# Patient Record
Sex: Female | Born: 1960 | Race: Black or African American | Hispanic: No | Smoking: Never smoker
Health system: Southern US, Community
[De-identification: ages and names within clinical notes are randomized; demographics above are authoritative.]

## PROBLEM LIST (undated history)

## (undated) DIAGNOSIS — H269 Unspecified cataract: Secondary | ICD-10-CM

## (undated) DIAGNOSIS — F329 Major depressive disorder, single episode, unspecified: Secondary | ICD-10-CM

## (undated) DIAGNOSIS — I10 Essential (primary) hypertension: Secondary | ICD-10-CM

## (undated) DIAGNOSIS — F32A Depression, unspecified: Secondary | ICD-10-CM

## (undated) DIAGNOSIS — D649 Anemia, unspecified: Secondary | ICD-10-CM

## (undated) DIAGNOSIS — G43909 Migraine, unspecified, not intractable, without status migrainosus: Secondary | ICD-10-CM

## (undated) DIAGNOSIS — J45909 Unspecified asthma, uncomplicated: Secondary | ICD-10-CM

## (undated) HISTORY — DX: Unspecified cataract: H26.9

## (undated) HISTORY — DX: Depression, unspecified: F32.A

## (undated) HISTORY — DX: Major depressive disorder, single episode, unspecified: F32.9

## (undated) HISTORY — PX: SMALL INTESTINE SURGERY: SHX150

## (undated) HISTORY — DX: Unspecified asthma, uncomplicated: J45.909

## (undated) HISTORY — PX: BREAST BIOPSY: SHX20

## (undated) HISTORY — DX: Anemia, unspecified: D64.9

## (undated) HISTORY — PX: COLON SURGERY: SHX602

## (undated) HISTORY — DX: Migraine, unspecified, not intractable, without status migrainosus: G43.909

## (undated) HISTORY — DX: Essential (primary) hypertension: I10

---

## 2008-11-17 HISTORY — PX: BREAST BIOPSY: SHX20

## 2012-08-06 ENCOUNTER — Emergency Department: Payer: Self-pay | Admitting: Emergency Medicine

## 2012-08-06 LAB — COMPREHENSIVE METABOLIC PANEL
Albumin: 3.3 g/dL — ABNORMAL LOW (ref 3.4–5.0)
Alkaline Phosphatase: 78 U/L (ref 50–136)
Anion Gap: 12 (ref 7–16)
Calcium, Total: 9.1 mg/dL (ref 8.5–10.1)
Creatinine: 0.94 mg/dL (ref 0.60–1.30)
Glucose: 116 mg/dL — ABNORMAL HIGH (ref 65–99)
Potassium: 3.5 mmol/L (ref 3.5–5.1)
SGOT(AST): 15 U/L (ref 15–37)
SGPT (ALT): 17 U/L (ref 12–78)
Total Protein: 8.2 g/dL (ref 6.4–8.2)

## 2012-08-06 LAB — URINALYSIS, COMPLETE
Bacteria: NONE SEEN
Bilirubin,UR: NEGATIVE
Ketone: NEGATIVE
Leukocyte Esterase: NEGATIVE
Ph: 8 (ref 4.5–8.0)
Protein: NEGATIVE
RBC,UR: 5 /HPF (ref 0–5)
Specific Gravity: 1.018 (ref 1.003–1.030)
Squamous Epithelial: 2
WBC UR: 6 /HPF (ref 0–5)

## 2012-08-06 LAB — TROPONIN I: Troponin-I: <0.02 ng/mL

## 2012-08-06 LAB — CBC
HGB: 10.3 g/dL — ABNORMAL LOW (ref 12.0–16.0)
MCH: 22.3 pg — ABNORMAL LOW (ref 26.0–34.0)
MCHC: 31.2 g/dL — ABNORMAL LOW (ref 32.0–36.0)
MCV: 72 fL — ABNORMAL LOW (ref 80–100)
Platelet: 416 10*3/uL (ref 150–440)
RDW: 18.7 % — ABNORMAL HIGH (ref 11.5–14.5)

## 2012-08-06 LAB — LIPASE, BLOOD: Lipase: 78 U/L (ref 73–393)

## 2012-09-17 ENCOUNTER — Emergency Department: Payer: Self-pay | Admitting: Emergency Medicine

## 2015-04-05 ENCOUNTER — Ambulatory Visit: Payer: Self-pay

## 2015-04-10 ENCOUNTER — Other Ambulatory Visit: Payer: Self-pay

## 2015-04-12 ENCOUNTER — Ambulatory Visit: Payer: Self-pay | Admitting: Ophthalmology

## 2015-04-17 ENCOUNTER — Encounter (INDEPENDENT_AMBULATORY_CARE_PROVIDER_SITE_OTHER): Payer: Self-pay

## 2015-04-17 ENCOUNTER — Ambulatory Visit: Payer: Self-pay

## 2015-04-24 ENCOUNTER — Ambulatory Visit: Payer: Self-pay

## 2015-05-16 ENCOUNTER — Ambulatory Visit
Admission: RE | Admit: 2015-05-16 | Discharge: 2015-05-16 | Disposition: A | Discharge status: Home / Self Care | Payer: Self-pay | Source: Ambulatory Visit | Attending: Oncology | Admitting: Oncology

## 2015-05-16 ENCOUNTER — Ambulatory Visit: Payer: Self-pay | Attending: Oncology

## 2015-05-16 VITALS — BP 136/95 | HR 95 | Temp 98.1°F | Resp 18 | Ht 64.57 in | Wt 225.8 lb

## 2015-05-16 DIAGNOSIS — Z0000 Encounter for general adult medical examination without abnormal findings: Secondary | ICD-10-CM

## 2015-05-16 NOTE — Progress Notes (Signed)
Subjective:     Patient ID: Kerri Lester, female   DOB: 11-12-1961, 54 y.o.   MRN: [id number]  HPI   Review of Systems     Objective:   Physical Exam  Pulmonary/Chest:    Large pendulous breasts.  Previous benign left breast biopsies 1977, and [facility].    Genitourinary: Cervix exhibits no motion tenderness, no discharge and no friability. Right adnexum displays no mass, no tenderness and no fullness. Left adnexum displays no mass, no tenderness and no fullness.       Assessment:     54 year old patient presents for [facility] clinic visit.  Patient screened, and meets [facility] eligibility.  Patient does not have insurance, Medicare or Medicaid.  Handout given on Affordable Care Act. nstructed patient on breast self-exam using teach back method.  CBE unremarkable    Plan:     Sent for bilateral screening mammogram.  Specimen collected for pap.

## 2015-05-22 ENCOUNTER — Ambulatory Visit: Payer: Self-pay

## 2015-05-22 LAB — PAP LB AND HPV HIGH-RISK
HPV, HIGH-RISK: NEGATIVE
PAP Smear Comment: 0

## 2015-05-29 NOTE — Progress Notes (Signed)
Letter mailed to patient to notify of normal mammogram, and pap smear results.Patient to return in one year for annual screening.  Copy to [facility]. 

## 2015-06-07 ENCOUNTER — Ambulatory Visit: Payer: Self-pay

## 2015-10-04 ENCOUNTER — Ambulatory Visit: Payer: Self-pay

## 2015-10-16 ENCOUNTER — Other Ambulatory Visit: Payer: Self-pay

## 2015-10-25 ENCOUNTER — Ambulatory Visit: Payer: Self-pay

## 2015-10-25 DIAGNOSIS — R7303 Prediabetes: Secondary | ICD-10-CM | POA: Insufficient documentation

## 2015-10-25 DIAGNOSIS — J45909 Unspecified asthma, uncomplicated: Secondary | ICD-10-CM | POA: Insufficient documentation

## 2015-12-27 ENCOUNTER — Ambulatory Visit: Payer: Self-pay

## 2015-12-27 DIAGNOSIS — I10 Essential (primary) hypertension: Secondary | ICD-10-CM | POA: Insufficient documentation

## 2015-12-27 LAB — HEMOGLOBIN A1C: HEMOGLOBIN A1C: 6

## 2015-12-27 LAB — BASIC METABOLIC PANEL
BUN: 11 mg/dL (ref 4–21)
CREATININE: 0.8 mg/dL (ref 0.5–1.1)
Glucose: 81 mg/dL
Potassium: 4.1 mmol/L (ref 3.4–5.3)
SODIUM: 142 mmol/L (ref 137–147)

## 2015-12-27 LAB — LIPID PANEL
CHOLESTEROL: 268 mg/dL — AB (ref 0–200)
HDL: 48 mg/dL (ref 35–70)
LDL Cholesterol: 179 mg/dL
TRIGLYCERIDES: 205 mg/dL — AB (ref 40–160)

## 2015-12-27 LAB — CBC AND DIFFERENTIAL
HEMATOCRIT: 42 % (ref 36–46)
Hemoglobin: 13.9 g/dL (ref 12.0–16.0)
PLATELETS: 418 10*3/uL — AB (ref 150–399)
WBC: 8.5 10^3/mL

## 2015-12-27 LAB — HEPATIC FUNCTION PANEL
ALK PHOS: 94 U/L (ref 25–125)
ALT: 16 U/L (ref 7–35)
AST: 13 U/L (ref 13–35)
Bilirubin, Total: 0.3 mg/dL

## 2016-01-01 ENCOUNTER — Other Ambulatory Visit: Payer: Self-pay | Admitting: Nurse Practitioner

## 2016-01-01 DIAGNOSIS — R519 Headache, unspecified: Secondary | ICD-10-CM

## 2016-01-01 DIAGNOSIS — R51 Headache: Principal | ICD-10-CM

## 2016-01-04 ENCOUNTER — Ambulatory Visit
Admission: RE | Admit: 2016-01-04 | Discharge: 2016-01-04 | Disposition: A | Discharge status: Home / Self Care | Payer: Self-pay | Source: Ambulatory Visit | Attending: Nurse Practitioner | Admitting: Nurse Practitioner

## 2016-01-04 DIAGNOSIS — G932 Benign intracranial hypertension: Secondary | ICD-10-CM | POA: Insufficient documentation

## 2016-01-04 DIAGNOSIS — R51 Headache: Secondary | ICD-10-CM

## 2016-01-04 DIAGNOSIS — R519 Headache, unspecified: Secondary | ICD-10-CM

## 2016-01-04 DIAGNOSIS — G4452 New daily persistent headache (NDPH): Secondary | ICD-10-CM | POA: Insufficient documentation

## 2016-02-06 DIAGNOSIS — R7303 Prediabetes: Secondary | ICD-10-CM

## 2016-02-06 DIAGNOSIS — I10 Essential (primary) hypertension: Secondary | ICD-10-CM

## 2016-02-06 DIAGNOSIS — J45909 Unspecified asthma, uncomplicated: Secondary | ICD-10-CM

## 2016-03-27 ENCOUNTER — Other Ambulatory Visit: Payer: Self-pay

## 2016-03-27 DIAGNOSIS — I10 Essential (primary) hypertension: Secondary | ICD-10-CM

## 2016-03-27 DIAGNOSIS — E782 Mixed hyperlipidemia: Secondary | ICD-10-CM

## 2016-03-27 DIAGNOSIS — E119 Type 2 diabetes mellitus without complications: Secondary | ICD-10-CM

## 2016-03-28 LAB — CBC WITH DIFFERENTIAL/PLATELET
BASOS ABS: 0 10*3/uL (ref 0.0–0.2)
Basos: 0 %
EOS (ABSOLUTE): 0.1 10*3/uL (ref 0.0–0.4)
Eos: 1 %
HEMATOCRIT: 41.3 % (ref 34.0–46.6)
HEMOGLOBIN: 13.1 g/dL (ref 11.1–15.9)
Immature Grans (Abs): 0 10*3/uL (ref 0.0–0.1)
Immature Granulocytes: 0 %
LYMPHS ABS: 2.3 10*3/uL (ref 0.7–3.1)
Lymphs: 21 %
MCH: 27 pg (ref 26.6–33.0)
MCHC: 31.7 g/dL (ref 31.5–35.7)
MCV: 85 fL (ref 79–97)
MONOCYTES: 5 %
Monocytes Absolute: 0.5 10*3/uL (ref 0.1–0.9)
NEUTROS ABS: 8 10*3/uL — AB (ref 1.4–7.0)
Neutrophils: 73 %
PLATELETS: 454 10*3/uL — AB (ref 150–379)
RBC: 4.85 x10E6/uL (ref 3.77–5.28)
RDW: 15.8 % — AB (ref 12.3–15.4)
WBC: 10.9 10*3/uL — ABNORMAL HIGH (ref 3.4–10.8)

## 2016-03-28 LAB — COMPREHENSIVE METABOLIC PANEL
A/G RATIO: 1 — AB (ref 1.2–2.2)
ALBUMIN: 4 g/dL (ref 3.5–5.5)
ALT: 14 IU/L (ref 0–32)
AST: 13 IU/L (ref 0–40)
Alkaline Phosphatase: 78 IU/L (ref 39–117)
BUN / CREAT RATIO: 13 (ref 9–23)
BUN: 13 mg/dL (ref 6–24)
CALCIUM: 9.4 mg/dL (ref 8.7–10.2)
CHLORIDE: 100 mmol/L (ref 96–106)
CO2: 21 mmol/L (ref 18–29)
Creatinine, Ser: 0.99 mg/dL (ref 0.57–1.00)
GFR, EST AFRICAN AMERICAN: 76 mL/min/{1.73_m2} (ref >59)
GFR, EST NON AFRICAN AMERICAN: 66 mL/min/{1.73_m2} (ref >59)
GLOBULIN, TOTAL: 3.9 g/dL (ref 1.5–4.5)
Glucose: 105 mg/dL — ABNORMAL HIGH (ref 65–99)
POTASSIUM: 3.6 mmol/L (ref 3.5–5.2)
Sodium: 140 mmol/L (ref 134–144)
TOTAL PROTEIN: 7.9 g/dL (ref 6.0–8.5)

## 2016-03-28 LAB — LIPID PANEL
CHOL/HDL RATIO: 5.8 ratio — AB (ref 0.0–4.4)
CHOLESTEROL TOTAL: 232 mg/dL — AB (ref 100–199)
HDL: 40 mg/dL (ref >39)
LDL Calculated: 159 mg/dL — ABNORMAL HIGH (ref 0–99)
Triglycerides: 165 mg/dL — ABNORMAL HIGH (ref 0–149)
VLDL Cholesterol Cal: 33 mg/dL (ref 5–40)

## 2016-03-28 LAB — TSH: TSH: 0.57 u[IU]/mL (ref 0.450–4.500)

## 2016-03-28 LAB — HEMOGLOBIN A1C
Est. average glucose Bld gHb Est-mCnc: 134 mg/dL
Hgb A1c MFr Bld: 6.3 % — ABNORMAL HIGH (ref 4.8–5.6)

## 2016-04-03 ENCOUNTER — Ambulatory Visit: Payer: Self-pay | Admitting: Urology

## 2016-04-03 VITALS — BP 141/95 | HR 90 | Wt 235.0 lb

## 2016-04-03 DIAGNOSIS — R7303 Prediabetes: Secondary | ICD-10-CM

## 2016-04-03 DIAGNOSIS — I10 Essential (primary) hypertension: Secondary | ICD-10-CM

## 2016-04-03 NOTE — Progress Notes (Signed)
Patient: Kerri Lester Female    DOB: 05-01-1961   55 y.o.   MRN: [id number] Visit Date: 04/03/2016  Today's Provider: [facility]   No chief complaint on file.  Subjective:    HPI Patient is here to review labs.  Total cholesterol is at 232-sometimes she eats fried foods, water and sodas, walking since the first of the year x 2 weekly  Asthma-albuterol twice daily, symbicort, veramyst, singulair  DM-controlled with diet  HA- CT of head normal      No Known Allergies Previous Medications   ALBUTEROL (PROVENTIL HFA;VENTOLIN HFA) 108 (90 BASE) MCG/ACT INHALER    Inhale 2 puffs into the lungs every 6 (six) hours as needed for wheezing or shortness of breath.   AMLODIPINE (NORVASC) 5 MG TABLET    Take 5 mg by mouth daily.   BUDESONIDE-FORMOTEROL (SYMBICORT) 160-4.5 MCG/ACT INHALER    Inhale 2 puffs into the lungs 2 (two) times daily.   CETIRIZINE (ZYRTEC) 10 MG TABLET    Take 10 mg by mouth daily.   DICLOFENAC (CATAFLAM) 50 MG TABLET    Take 50 mg by mouth as needed (1 50 mg tab PO with food every 8 hrs PRN.).   FLUOXETINE (PROZAC) 20 MG TABLET    Take 20 mg by mouth daily.   FLUTICASONE (VERAMYST) 27.5 MCG/SPRAY NASAL SPRAY    Place 2 sprays into the nose daily.   MONTELUKAST (SINGULAIR) 10 MG TABLET    Take 10 mg by mouth at bedtime. Reported on 04/03/2016   OMEPRAZOLE (PRILOSEC) 20 MG CAPSULE    Take 40 mg by mouth daily.    TRIAMTERENE-HYDROCHLOROTHIAZIDE (MAXZIDE-25) 37.5-25 MG TABLET    Take 0.5 tablets by mouth daily.    Review of Systems  Constitutional: Negative.   HENT: Negative.   Eyes: Negative.   Respiratory: Negative.   Cardiovascular: Negative.   Gastrointestinal: Negative.   Endocrine: Negative.   Genitourinary: Negative.   Musculoskeletal: Negative.   Skin: Negative.   Allergic/Immunologic: Negative.   Neurological: Negative.   Hematological: Negative.   Psychiatric/Behavioral: Negative.     Social History  Substance Use Topics  .  Smoking status: Never Smoker   . Smokeless tobacco: Never Used  . Alcohol Use: 0.6 oz/week    1 Cans of beer per week     Comment: Socially    Objective:   BP 141/95 mmHg  Pulse 90  Wt 235 lb (106.595 kg)  LMP 03/20/2016 (Approximate)  Physical Exam  Constitutional: She is oriented to person, place, and time. She appears well-developed and well-nourished.  HENT:  Head: Normocephalic and atraumatic.  Eyes: Conjunctivae and EOM are normal. Pupils are equal, round, and reactive to light.  Cardiovascular: Normal rate and normal heart sounds.   Pulmonary/Chest: Effort normal and breath sounds normal.  Abdominal: Soft. Bowel sounds are normal.  Musculoskeletal: Normal range of motion.  Neurological: She is oriented to person, place, and time. She has normal reflexes.  Skin: Skin is warm.  Psychiatric: She has a normal mood and affect. Her behavior is normal. Judgment and thought content normal.        Assessment & Plan:     1. HLD  -discuss diet  -increase exercise  2. GERD  -continue omprezole  3. Elevated platelets  -confirm with repeated platelets  -follow up in one month  4. HTN  -continue amlodipine  5. Headache  -Head CT normal    Patient had to leave, [place] infant  [facility]  [facility]

## 2016-04-10 ENCOUNTER — Ambulatory Visit: Payer: Self-pay | Admitting: Ophthalmology

## 2016-05-01 ENCOUNTER — Other Ambulatory Visit: Payer: Self-pay

## 2016-05-08 ENCOUNTER — Ambulatory Visit: Payer: Self-pay

## 2016-07-25 IMAGING — CT CT HEAD W/O CM
2 series · 15 of 30 positions shown, 17 images · non-contrast
Comparison: None.

CLINICAL DATA: Severe headache behind the right eye for 2 to
months. History of pseudotumor cerebri.

EXAM:
CT HEAD WITHOUT CONTRAST
TECHNIQUE: Contiguous axial images were obtained from the base of the skull
through the vertex without intravenous contrast.

[Series 2: head wo · axial · 0.41mm/px · z∈[-76,+34]mm · 7 of 30 slices shown, 9 images]
[im 4/30  brain]
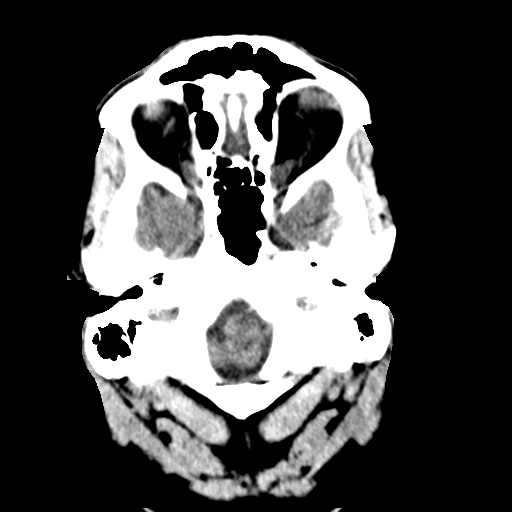
[im 4/30  bone]
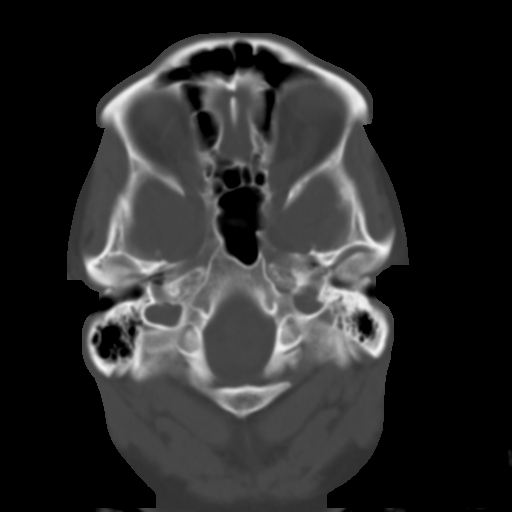
[im 8/30  brain]
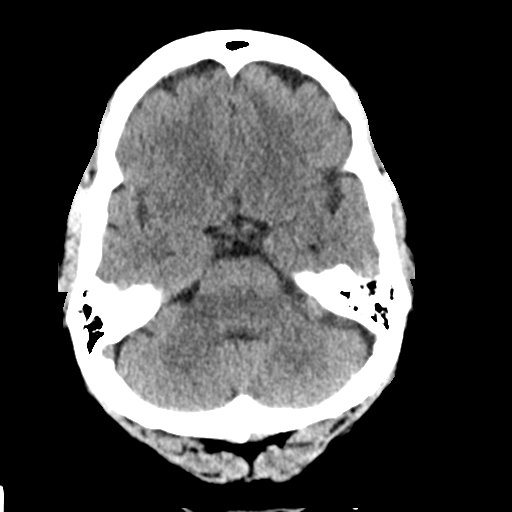
[im 11/30  brain]
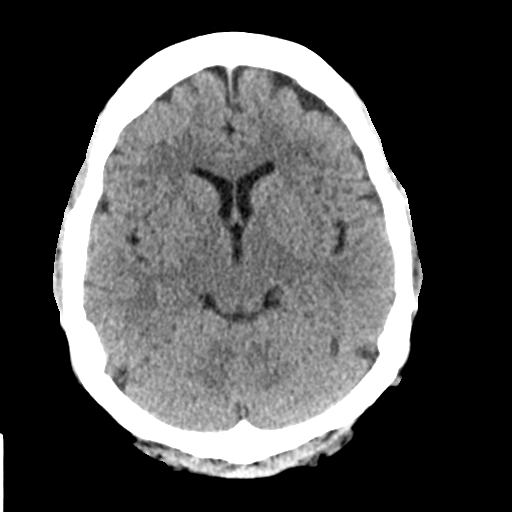
[im 15/30  brain]
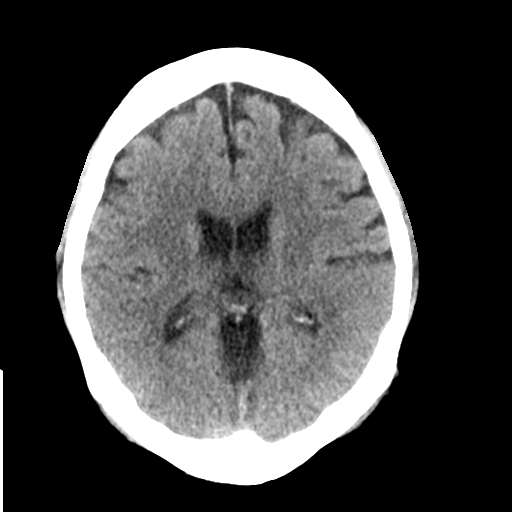
[im 19/30  brain]
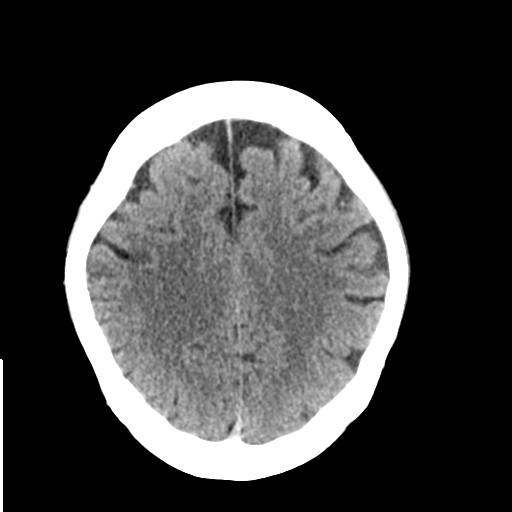
[im 19/30  bone]
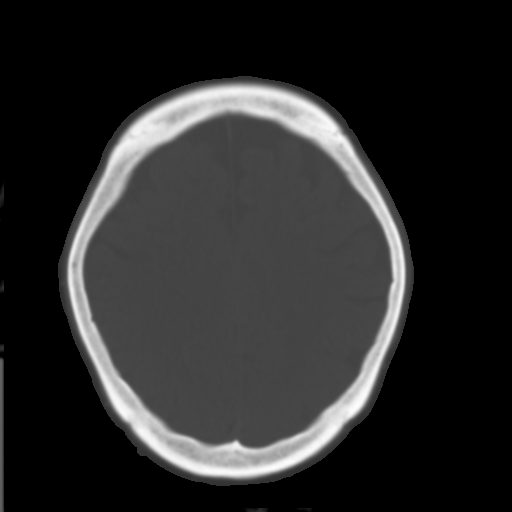
[im 22/30  brain]
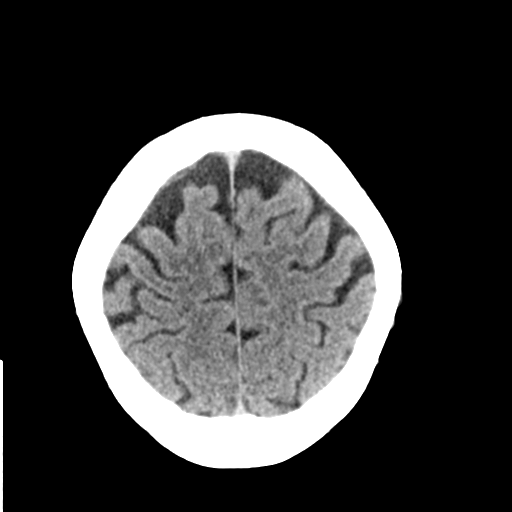
[im 26/30  brain]
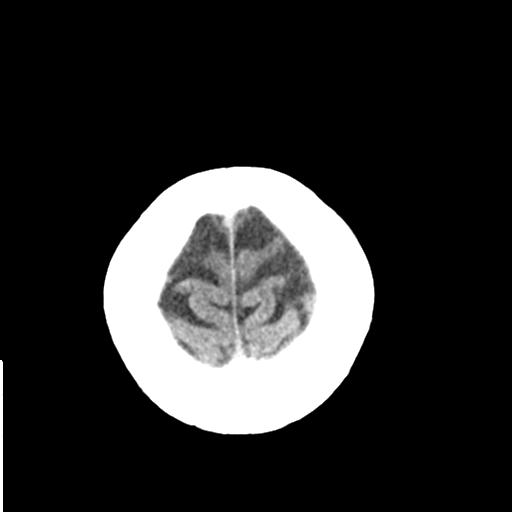

[Series 3: head bone · axial · 0.41mm/px · z∈[-77,+41]mm · 8 of 75 slices shown]
[im 8/75  bone]
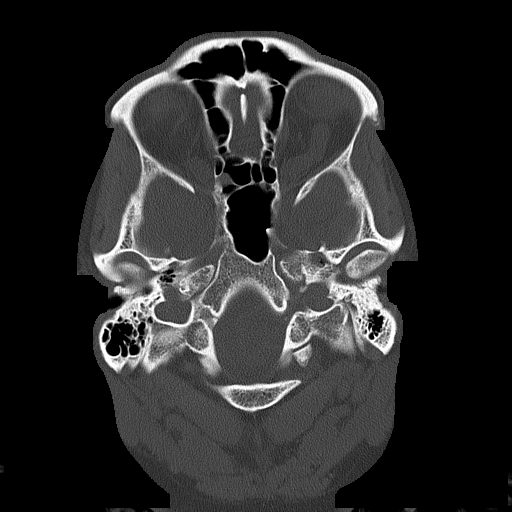
[im 15/75  bone]
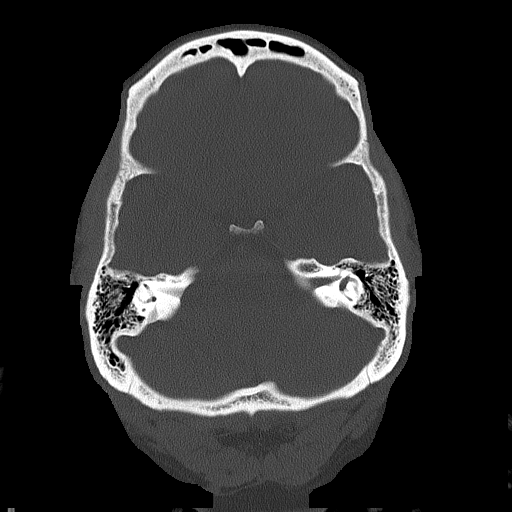
[im 23/75  bone]
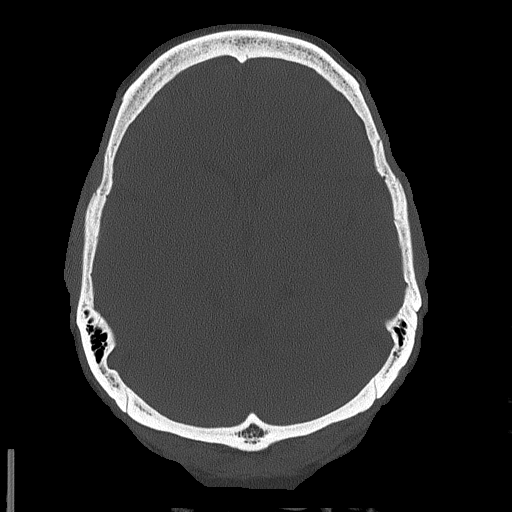
[im 34/75  bone]
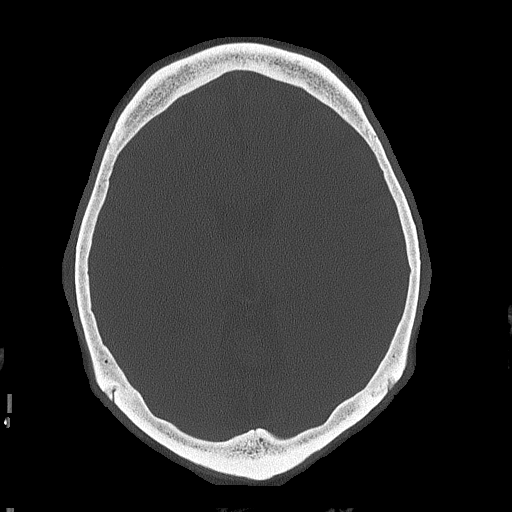
[im 41/75  bone]
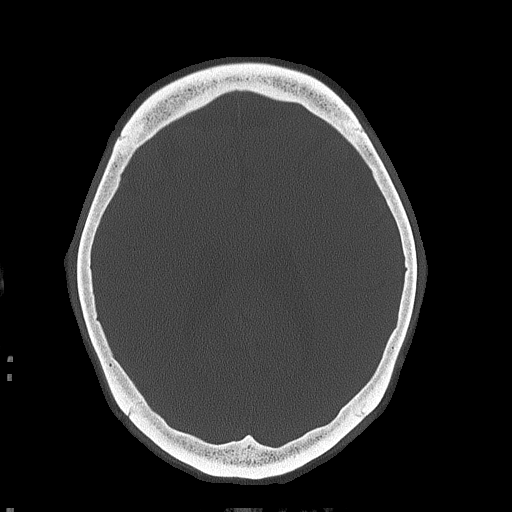
[im 52/75  bone]
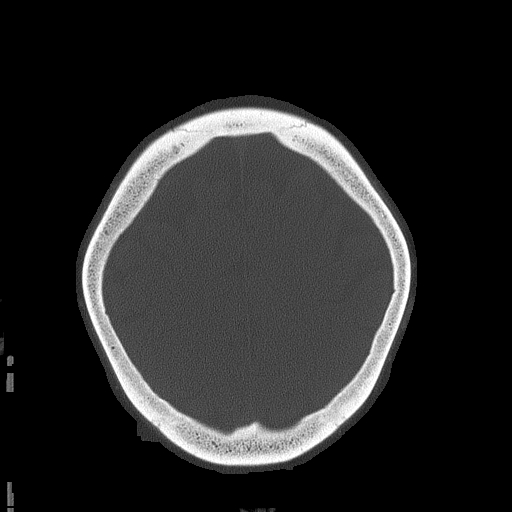
[im 60/75  bone]
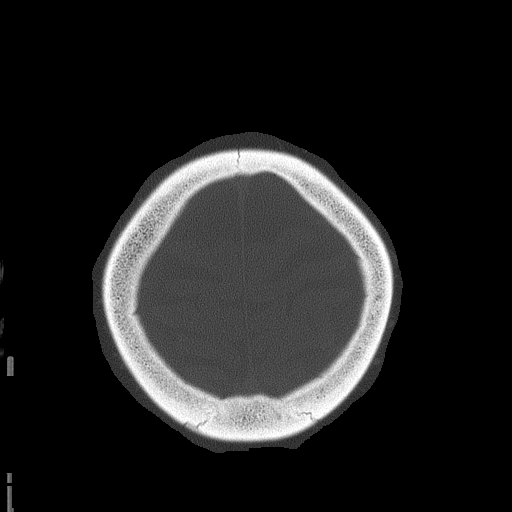
[im 67/75  bone]
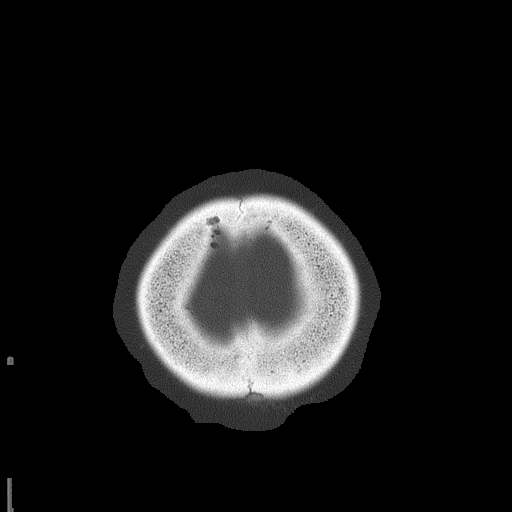

[15 of 30 positions shown; findings below may reference images not displayed]

FINDINGS: Brain: No evidence of acute infarction, hemorrhage, extra-axial
collection, ventriculomegaly, or mass effect.

Vascular: No hyperdense vessel or unexpected calcification.

Skull: Negative for fracture or focal lesion.

Sinuses/Orbits: No acute findings.

Other: None.
IMPRESSION: Normal unenhanced CT scan of the brain

## 2016-12-09 ENCOUNTER — Other Ambulatory Visit: Payer: Self-pay

## 2016-12-09 NOTE — Telephone Encounter (Signed)
Received PAP application from [facility] for Ventolin HFA placed for provider to sign. 

## 2016-12-12 NOTE — Telephone Encounter (Signed)
Placed signed application/script in [facility] folder for pickup. 

## 2017-04-09 ENCOUNTER — Ambulatory Visit: Payer: Self-pay | Admitting: Ophthalmology

## 2017-04-16 ENCOUNTER — Other Ambulatory Visit: Payer: Self-pay

## 2017-04-16 DIAGNOSIS — Z0000 Encounter for general adult medical examination without abnormal findings: Secondary | ICD-10-CM

## 2017-04-17 LAB — CBC WITH DIFFERENTIAL
BASOS: 0 %
Basophils Absolute: 0 10*3/uL (ref 0.0–0.2)
EOS (ABSOLUTE): 0.3 10*3/uL (ref 0.0–0.4)
EOS: 3 %
HEMATOCRIT: 40.9 % (ref 34.0–46.6)
HEMOGLOBIN: 13.2 g/dL (ref 11.1–15.9)
Immature Grans (Abs): 0 10*3/uL (ref 0.0–0.1)
Immature Granulocytes: 0 %
LYMPHS ABS: 2.5 10*3/uL (ref 0.7–3.1)
Lymphs: 29 %
MCH: 27.1 pg (ref 26.6–33.0)
MCHC: 32.3 g/dL (ref 31.5–35.7)
MCV: 84 fL (ref 79–97)
MONOCYTES: 5 %
Monocytes Absolute: 0.4 10*3/uL (ref 0.1–0.9)
NEUTROS ABS: 5.4 10*3/uL (ref 1.4–7.0)
Neutrophils: 63 %
RBC: 4.87 x10E6/uL (ref 3.77–5.28)
RDW: 16.1 % — ABNORMAL HIGH (ref 12.3–15.4)
WBC: 8.7 10*3/uL (ref 3.4–10.8)

## 2017-04-23 ENCOUNTER — Ambulatory Visit: Payer: Self-pay

## 2017-06-10 ENCOUNTER — Telehealth: Payer: Self-pay | Admitting: Nurse Practitioner

## 2017-06-10 NOTE — Telephone Encounter (Signed)
Wants to be new pt. Call back to make appointment

## 2017-06-11 NOTE — Telephone Encounter (Signed)
She is an existing patient

## 2017-07-09 ENCOUNTER — Ambulatory Visit: Payer: Self-pay

## 2017-08-19 DIAGNOSIS — K5792 Diverticulitis of intestine, part unspecified, without perforation or abscess without bleeding: Secondary | ICD-10-CM | POA: Insufficient documentation

## 2017-12-20 DIAGNOSIS — E66812 Obesity, class 2: Secondary | ICD-10-CM | POA: Insufficient documentation

## 2017-12-20 DIAGNOSIS — Z932 Ileostomy status: Secondary | ICD-10-CM | POA: Insufficient documentation

## 2018-04-15 ENCOUNTER — Ambulatory Visit: Payer: Self-pay | Admitting: Ophthalmology

## 2018-04-29 ENCOUNTER — Ambulatory Visit: Payer: Self-pay | Admitting: Ophthalmology

## 2018-05-06 ENCOUNTER — Ambulatory Visit: Payer: Self-pay

## 2018-06-17 ENCOUNTER — Ambulatory Visit: Payer: Medicaid Other | Admitting: Urology

## 2018-06-17 VITALS — BP 157/99 | HR 86 | Temp 98.4°F | Ht 64.0 in | Wt 229.7 lb

## 2018-06-17 DIAGNOSIS — E041 Nontoxic single thyroid nodule: Secondary | ICD-10-CM

## 2018-06-17 DIAGNOSIS — R55 Syncope and collapse: Secondary | ICD-10-CM

## 2018-06-17 MED ORDER — ALBUTEROL SULFATE HFA 108 (90 BASE) MCG/ACT IN AERS: 2.0000 | INHALATION_SPRAY | Freq: Four times a day (QID) | RESPIRATORY_TRACT | 3 refills | Status: AC | PRN

## 2018-06-17 MED ORDER — CETIRIZINE HCL 10 MG PO TABS: 10.0000 mg | ORAL_TABLET | Freq: Every day | ORAL | 3 refills | Status: AC

## 2018-06-17 MED ORDER — AMLODIPINE BESYLATE 5 MG PO TABS: 5.0000 mg | ORAL_TABLET | Freq: Every day | ORAL | 3 refills | Status: AC

## 2018-06-17 MED ORDER — FLUOXETINE HCL 20 MG PO TABS: 20.0000 mg | ORAL_TABLET | Freq: Every day | ORAL | 3 refills | Status: DC

## 2018-06-17 MED ORDER — MONTELUKAST SODIUM 10 MG PO TABS: 10.0000 mg | ORAL_TABLET | Freq: Every day | ORAL | 3 refills | Status: AC

## 2018-06-17 MED ORDER — TRIAMTERENE-HCTZ 37.5-25 MG PO TABS: 0.5000 | ORAL_TABLET | Freq: Every day | ORAL | 3 refills | Status: AC

## 2018-06-17 MED ORDER — BUDESONIDE-FORMOTEROL FUMARATE 160-4.5 MCG/ACT IN AERO: 2.0000 | INHALATION_SPRAY | Freq: Two times a day (BID) | RESPIRATORY_TRACT | 3 refills | Status: AC

## 2018-06-17 MED ORDER — DICLOFENAC POTASSIUM 50 MG PO TABS: 50.0000 mg | ORAL_TABLET | ORAL | 0 refills | Status: AC | PRN

## 2018-06-17 MED ORDER — FLUTICASONE FUROATE 27.5 MCG/SPRAY NA SUSP: 2.0000 | Freq: Every day | NASAL | 3 refills | Status: AC

## 2018-06-17 MED ORDER — OMEPRAZOLE 20 MG PO CPDR: 40.0000 mg | DELAYED_RELEASE_CAPSULE | Freq: Every day | ORAL | 3 refills | Status: AC

## 2018-06-17 NOTE — Progress Notes (Signed)
  Patient: Kerri Lester Female    DOB: [birth year]   57 y.o.   MRN: [id number] Visit Date: 06/17/2018  Today's Provider: Michiel CowboySHANNON Aleksander Edmiston, PA-C   Chief Complaint  Patient presents with  . Medication Refill  . Headache    dizzy, light-headed, blurry vision, vomiting (patient recently admitted to ED for head)   Subjective:    HPI [facility] on 05/28/2018   Troponin negative, CBC and CMP unremarkable, EKG normal  Still having dizziness, nausea, light headed, vomiting    Having a lot of stress lately   Medications refilled - has been out for BP meds for one week   No Known Allergies Previous Medications   No medications on file    Review of Systems  Neurological: Positive for dizziness, light-headedness and headaches.  All other systems reviewed and are negative.   Social History   Tobacco Use  . Smoking status: Never Smoker  . Smokeless tobacco: Never Used  Substance Use Topics  . Alcohol use: Yes    Alcohol/week: 0.6 oz    Types: 1 Cans of beer per week    Comment: Socially    Objective:   BP (!) 157/99 (BP Location: Left Arm, Patient Position: Sitting)   Pulse 86   Temp 98.4 F (36.9 C) (Oral)   Ht 5\' 4"  (1.626 m)   Wt 229 lb 11.2 oz (104.2 kg)   BMI 39.43 kg/m   Physical Exam  Constitutional: She is oriented to person, place, and time. She appears well-developed and well-nourished.  HENT:  Head: Normocephalic and atraumatic.  Eyes: Pupils are equal, round, and reactive to light. EOM are normal.  Neck: Normal range of motion. Neck supple.  Node on right side of thyroid 5 mm x 5 mm  Cardiovascular: Normal rate, regular rhythm and normal heart sounds.  Pulmonary/Chest: Effort normal and breath sounds normal.  Abdominal: Soft.  Neurological: She is oriented to person, place, and time.  Skin: Skin is warm and dry.  Vitals reviewed.       Assessment & Plan:    1. Thyroid nodule  Obtain a thyroid us  TSH  2. Syncopal episodes  ED  check found no etiology  Having a lot of stress - appointment with Heather  3. DM  Check HgbA1c        Michiel CowboySHANNON Sarenity Ramaker, PA-C   [facility]

## 2018-06-18 LAB — TSH: TSH: 1.57 u[IU]/mL (ref 0.450–4.500)

## 2018-06-18 LAB — HEMOGLOBIN A1C
Est. average glucose Bld gHb Est-mCnc: 126 mg/dL
Hgb A1c MFr Bld: 6 % — ABNORMAL HIGH (ref 4.8–5.6)

## 2018-06-23 ENCOUNTER — Ambulatory Visit: Payer: Medicaid Other | Admitting: Licensed Clinical Social Worker

## 2018-06-23 DIAGNOSIS — F332 Major depressive disorder, recurrent severe without psychotic features: Secondary | ICD-10-CM

## 2018-06-23 DIAGNOSIS — F411 Generalized anxiety disorder: Secondary | ICD-10-CM

## 2018-06-23 DIAGNOSIS — F4310 Post-traumatic stress disorder, unspecified: Secondary | ICD-10-CM
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
## 2018-06-25 ENCOUNTER — Ambulatory Visit
Admission: RE | Admit: 2018-06-25 | Discharge: 2018-06-25 | Disposition: A | Discharge status: Home / Self Care | Payer: Medicaid Other | Source: Ambulatory Visit | Attending: Urology | Admitting: Urology

## 2018-06-25 DIAGNOSIS — E041 Nontoxic single thyroid nodule: Secondary | ICD-10-CM | POA: Diagnosis present

## 2018-06-30 ENCOUNTER — Ambulatory Visit: Payer: Medicaid Other | Admitting: Licensed Clinical Social Worker

## 2018-06-30 DIAGNOSIS — F411 Generalized anxiety disorder: Secondary | ICD-10-CM

## 2018-06-30 DIAGNOSIS — F331 Major depressive disorder, recurrent, moderate: Secondary | ICD-10-CM

## 2018-06-30 NOTE — Progress Notes (Signed)
Subjective:  Patient ID: Kerri Lester, female   DOB: 1961/04/29, 57 y.o.   MRN: [id number]    Increase emotional regulation  Kerri Lester  presents with depression. Onset of symptoms was last few years with unchanged unchanged. Symptoms have been occurringNot influenced by the time of the day.  Symptoms are currently rated moderate. Associated signs and symptoms include: attention difficulties, highly negative, poor social interaction, stubborn and feeling overwhelmed.Financial difficulties Health problems Occupational concerns Other: hoarding.   She reports that she previously took Trazodone and is okay with starting it again. She notes that her next appointment with her provider in clinic is September 8th. She notes that she is okay with the recommendations made and anything that will make her feel more like herself. She notes that she hasn't done corner of bedroom yet but did work on finishing up cleaning the bathroom. She explains that her goals are to de clutter her house, fix at least one of the extra cars she has, and eventually get a job. She notes that she has a problem with bringing herself to donate or give away things even after she has sorted them out. She notes that she would like to get back to reading again because it used to bring her job. She notes that she also has problems with looking in the mirror and low self esteem. She denies suicidal and homicidal thoughts.   Therapeutic Interventions: Cognitive Behavioral therapy was utilized by the clinician focusing on her symptoms of depression by discussing the concept of behavioral activation. Clinician processed with the patient regarding how she has been doing since the last session. Clinician processed with the patient regarding the psychiatrist would like her to stay on Prozac for now and add Trazodone 50 mg at bedtime to assist with insomnia. Clinician asked the patient how she felt about the plans. Clinician processed with the  patient regarding what she would like to work on while in therapy. Clinician suggested that the patient come up with a to do list for de cluttering her house and getting things organized. Clinician explained that working on certain areas at a time for a certain amount of time each day should yield better results than thinking about it and not putting a plan into action. Clinician explained that it sounds like she goes through the motions of gathering things to donate or give away but can't bring herself to finish it. Clinician processed with the patient regarding if she has ever heard of Marcelline DeistMarie Khondo. Clinician explained that she is an Futures traderauthor and professional organizer. Clinician suggested she look up one of her books and watch her series on Netflix. Clinician explained that the author's philosophy is to get way of things that do no bring you joy.   Return visit in 1 week.    Effectiveness:  Review of last therapy session (1) and Review of psycho-social stressors (1). Progressing It is felt more time is needed for Interventions to work. . Patient is fully  Other:  orientated to time and place. Patient's Appropriate into problems. Active. Thought process is  Coherent.Minimal: No identifiable suicidal ideation.  Patients presenting with no risk factors but with morbid ruminations; may be classified as minimal risk based on the severity of the depressive symptoms and None.   Homework: Look into book or netflix Series Marcelline DeistMarie Khondo. Behavioral activation.  Plan: Follow up with Carey BullocksHeather Simpson, LCSW at [facility] in one week or earlier if needed.

## 2018-07-01 ENCOUNTER — Other Ambulatory Visit: Payer: Self-pay | Admitting: Urology

## 2018-07-01 MED ORDER — TRAZODONE HCL 50 MG PO TABS: 50.0000 mg | ORAL_TABLET | Freq: Every day | ORAL | 3 refills | Status: DC

## 2018-07-01 MED ORDER — FLUOXETINE HCL 20 MG PO TABS: 20.0000 mg | ORAL_TABLET | Freq: Every day | ORAL | 3 refills | Status: AC

## 2018-07-01 NOTE — Progress Notes (Signed)
Rx in for Prozac and trazodone.

## 2018-07-08 ENCOUNTER — Ambulatory Visit: Payer: Medicaid Other | Admitting: Licensed Clinical Social Worker

## 2018-07-13 ENCOUNTER — Telehealth: Payer: Self-pay | Admitting: Licensed Clinical Social Worker

## 2018-07-13 NOTE — Telephone Encounter (Signed)
Clinician reached out to the patient regarding cancelling her last appointment and seeing if she would like to reschedule. She rescheduled her for Wednesday 07/14/18 @ 9:00 am.

## 2018-07-14 ENCOUNTER — Ambulatory Visit: Payer: Medicaid Other | Admitting: Licensed Clinical Social Worker

## 2018-07-14 ENCOUNTER — Encounter: Payer: Self-pay | Admitting: Licensed Clinical Social Worker

## 2018-07-14 DIAGNOSIS — F411 Generalized anxiety disorder: Secondary | ICD-10-CM

## 2018-07-14 DIAGNOSIS — F331 Major depressive disorder, recurrent, moderate: Secondary | ICD-10-CM

## 2018-07-14 NOTE — Progress Notes (Signed)
Subjective:  Patient ID: Kerri Lester, female   DOB: 1961/09/29, 57 y.o.   MRN: [id number]    Increase emotional regulation  Kerri Lester   presents with depression. Onset of symptoms was last several years with unchanged unchanged. Symptoms have been occurringNot influenced by the time of the day.  Symptoms are currently rated moderate. Associated signs and symptoms include: poor social interaction, sadness and feeling empty or numb.Financial difficulties Health problems Occupational concerns.   She reports that she missed the last appointment due to a stomach virus. She explains that despite being sick she did go threw a bunch of paperwork and things to shred. She explains that she really had to push herself to get up this morning. She notes that she hasn't slept much at all despite starting the Trazodone. She explains that she is having difficulty falling asleep and wakes up only after a few hours. She notes that she really couldn't sleep at all the past two nights. She notes that she is feeling numb, out of sync, and not like herself at all. She notes that she has her faith but is worried about loosing what she does have and that their will come a time when her sons cannot support her. She notes that she is applying to jobs but wants the doctor to figure out what is going on with her in terms of her passing out and dizzy spells. She notes that her blood pressure is high and thinks she is anemic. She notes that she is also concerned about her iron. She denies suicidal and homicidal thoughts.   Therapeutic Interventions: Cognitive Behavioral therapy was utilized by the clinician focusing on her depression and affect on normal cognition. Clinician processed with the patient regarding how she has been doing since the last session. Clinician processed with the patient regarding if she started taking the Trazodone for sleep and if it is helping with her insomnia. Clinician processed with the patient  regarding that its possible that the dosage is to low and needs to be raised or her Prozac is not working. Clinician explained that she would have a consultation with Dr. Mare FerrariLavine this afternoon and discuss what is happening with him. Clinician processed with the patient regarding if she is still feeling depressed. Clinician explained that until her health issues are under control that she agrees with her that getting a job is a mute point. Clinician explained that it could be needing to make changes in her diet or her body is not getting enough nutrients. Clinician explained that when she sees Michiel CowboyShannon McGowan PA next week that she will most likely have more concrete answers.   Return visit in 1 week.    Effectiveness:  Review of last therapy session (1). Progressing It is felt more time is needed for Interventions to work.  . Patient is fully  Other:  orientated to time and place. Patient's Appropriate into problems. Active. Thought process is  Coherent.Minimal: No identifiable suicidal ideation.  Patients presenting with no risk factors but with morbid ruminations; may be classified as minimal risk based on the severity of the depressive symptoms and None.   Homework: Focus on gratitude and continue with de-cluttering process. Take things one day at a time.   Plan: Follow up with Kerri BullocksHeather Simpson, LCSW at [facility] in one week or earlier.

## 2018-07-22 ENCOUNTER — Ambulatory Visit: Payer: Medicaid Other | Admitting: Licensed Clinical Social Worker

## 2018-07-22 ENCOUNTER — Other Ambulatory Visit: Payer: Self-pay | Admitting: Adult Health Nurse Practitioner

## 2018-07-22 ENCOUNTER — Ambulatory Visit: Payer: Medicaid Other | Admitting: Adult Health Nurse Practitioner

## 2018-07-22 VITALS — BP 123/89 | HR 82 | Temp 98.0°F | Ht 64.0 in | Wt 232.0 lb

## 2018-07-22 DIAGNOSIS — R7303 Prediabetes: Secondary | ICD-10-CM

## 2018-07-22 DIAGNOSIS — G43809 Other migraine, not intractable, without status migrainosus: Secondary | ICD-10-CM

## 2018-07-22 DIAGNOSIS — F331 Major depressive disorder, recurrent, moderate: Secondary | ICD-10-CM

## 2018-07-22 DIAGNOSIS — F32A Depression, unspecified: Secondary | ICD-10-CM | POA: Insufficient documentation

## 2018-07-22 DIAGNOSIS — D649 Anemia, unspecified: Secondary | ICD-10-CM | POA: Insufficient documentation

## 2018-07-22 DIAGNOSIS — G43909 Migraine, unspecified, not intractable, without status migrainosus: Secondary | ICD-10-CM | POA: Insufficient documentation

## 2018-07-22 DIAGNOSIS — F330 Major depressive disorder, recurrent, mild: Secondary | ICD-10-CM

## 2018-07-22 DIAGNOSIS — F329 Major depressive disorder, single episode, unspecified: Secondary | ICD-10-CM | POA: Insufficient documentation

## 2018-07-22 DIAGNOSIS — I10 Essential (primary) hypertension: Secondary | ICD-10-CM

## 2018-07-22 MED ORDER — MIRTAZAPINE 15 MG PO TABS: ORAL_TABLET | ORAL | 0 refills | Status: AC

## 2018-07-22 MED ORDER — FERROUS SULFATE 325 (65 FE) MG PO TABS: 325.0000 mg | ORAL_TABLET | Freq: Every day | ORAL | 3 refills | Status: AC

## 2018-07-22 NOTE — Progress Notes (Signed)
Patient: Kerri Lester Female    DOB: 1961/03/22   57 y.o.   MRN: [id number] Visit Date: 07/22/2018  Today's Provider: Jacelyn Pi, NP   Chief Complaint  Patient presents with  . Follow-up  . Back Pain    pain when walking   . Leg Pain    leg pain when walking; also hvaing trouble with balance and dizziness   Subjective:    HPI  Continues to have lightheadedness, dizziness and problems with balance.  States that her HA are increasing-used to a special opthalmologist. Also has a hx of migraines. Has seen a Neurologist when in the hospital earlier this year. Denies acute visual changes.  Not related to any particular event.  Happening 5/7 days during the week.   Pt states a couple days ago when she wiped she had bright red blood on the tissue over 24 hours it changed to a dark red color.   Pt states that she was having aching in her legs, "a tightness all the way down both legs" Only pain with increased walking. States that her lower back has began to hurt again. But the two are independent of each other. She took diclofenac with minimal results.      No Known Allergies Previous Medications   ALBUTEROL (PROVENTIL HFA;VENTOLIN HFA) 108 (90 BASE) MCG/ACT INHALER    Inhale 2 puffs into the lungs every 6 (six) hours as needed for wheezing or shortness of breath.   AMLODIPINE (NORVASC) 5 MG TABLET    Take 1 tablet (5 mg total) by mouth daily.   BUDESONIDE-FORMOTEROL (SYMBICORT) 160-4.5 MCG/ACT INHALER    Inhale 2 puffs into the lungs 2 (two) times daily.   CETIRIZINE (ZYRTEC) 10 MG TABLET    Take 1 tablet (10 mg total) by mouth daily.   DICLOFENAC (CATAFLAM) 50 MG TABLET    Take 1 tablet (50 mg total) by mouth as needed (1 50 mg tab PO with food every 8 hrs PRN.).   FLUOXETINE (PROZAC) 20 MG TABLET    Take 1 tablet (20 mg total) by mouth daily.   FLUTICASONE (VERAMYST) 27.5 MCG/SPRAY NASAL SPRAY    Place 2 sprays into the nose daily.   MIRTAZAPINE (REMERON) 15 MG TABLET    1/2  tablet at night time 1 week then 1 tablet nightly.   MONTELUKAST (SINGULAIR) 10 MG TABLET    Take 1 tablet (10 mg total) by mouth at bedtime. Reported on 04/03/2016   OMEPRAZOLE (PRILOSEC) 20 MG CAPSULE    Take 2 capsules (40 mg total) by mouth daily.   TRIAMTERENE-HYDROCHLOROTHIAZIDE (MAXZIDE-25) 37.5-25 MG TABLET    Take 0.5 tablets by mouth daily.    Review of Systems  All other systems reviewed and are negative.   Social History   Tobacco Use  . Smoking status: Never Smoker  . Smokeless tobacco: Never Used  Substance Use Topics  . Alcohol use: Yes    Alcohol/week: 1.0 standard drinks    Types: 1 Cans of beer per week    Comment: Socially    Objective:   BP 123/89 (BP Location: Left Arm, Patient Position: Sitting)   Pulse 82   Temp 98 F (36.7 C)   Ht 5\' 4"  (1.626 m)   Wt 232 lb (105.2 kg)   SpO2 97%   BMI 39.82 kg/m   Physical Exam  Constitutional: She is oriented to person, place, and time. She appears well-developed and well-nourished.  HENT:  Head: Normocephalic and atraumatic.  Neck: Normal range  of motion. Neck supple.  Cardiovascular: Normal rate, regular rhythm and normal heart sounds.  Pulmonary/Chest: Effort normal and breath sounds normal.  Abdominal: Soft. Bowel sounds are normal.  Neurological: She is alert and oriented to person, place, and time.  CN grossly intact  Skin: Skin is warm and dry.        Assessment & Plan:        Pre-DM:  Discussed health lifestyle changes.   Refer to Dr. Justice Rocher for back pain.   Refer to Neurology for continued HA with dizziness and lightheadedness after trial off Maxzide, if unresolved refer to Neurology.  Will trial off Maxzide x week then return to clinic.   Will check CBC due to acute episode of bleeding. Resume Iron daily for Anemia.   Given order to start Remeron 7.5mg  x 1 week then 15mg  nightly.   Reviewed labs and ultrasound results.    Fu in 1 week for BP evaluation and effects of Remeron.     Jacelyn Pi, NP   [facility]

## 2018-07-22 NOTE — Progress Notes (Signed)
Consultation with Dr. Mare Ferrari previously on August 28 th 2019,  Per consult with Dr. Mare Ferrari, Discontinue Trazodone 50 mg at bedtime due patient compliant of it not helping with insomnia, and start Mirtazapine 7.5 mg at bedtime then increase to 15 mg within one week if tolerated for symptoms of depression and insomnia. Patient was agreeable to the plans.  Subjective:  Patient ID: Kerri Lester, female   DOB: 1961/05/14, 57 y.o.   MRN: [id number]    Increase emotional regulation  Kerri Lester  presents with depression. Onset of symptoms was in the last few years with gradually improving improving. Symptoms have been occurringNot influenced by the time of the day.  Symptoms are currently rated moderate. Associated signs and symptoms include: poor social interaction, sadness and fatigueFinancial difficulties Health problems.   She reports that she is still trying to figure out her health issues. She explains that she is glad to try another medications in terms something that will help not only with her depression but her sleep as well. She notes that she is doing partially better compared to last week. She explains that she threw away more papers, threw away some clothes, and is making progress towards de cluttering her house. She notes that the other positive is as she is straightening up that she is finding everything she needs.  She notes that she has been able to sleep a little but its still not great. She reports that she is no longer struggling to get out of bed. She denies suicidal and homicidal thoughts.   Therapeutic Interventions: Cognitive Behavioral therapy was utilized by the clinician focusing on the patient's depression and affect on normal cognition. Clinician processed with the patient regarding how she has been doing since the last session. Clinician processed with the patient regarding that Dr. Mare Ferrari would like to discontinue her Trazodone 50 mg at bedtime and start her on Mirtazapine 7.5  mg for one week then increase it to 15 mg if tolerated for insomnia and depression. Clinician explained that Mirtazapine can make you sleepy and sedated during the day so that's the reason it is prescribed a bedtime. Clinician explained that in terms of her health issues that the provider in clinic will have some insight into her labs completed the last time she was in clinic. Clinician explained that she is glad to hear that she is making progress in terms of cleaning her house and de cluttering things. Clinician encouraged the patient to focus on the positives versus the negative and gratitude.   Return visit in 1 week.    Effectiveness:  Review of last therapy session (1). Progressing It is felt more time is needed for Interventions to work.  . Patient is fully  Other:  orientated to time and place. Patient's Appropriate into problems. Active. Thought process is  Coherent.Minimal: No identifiable suicidal ideation.  Patients presenting with no risk factors but with morbid ruminations; may be classified as minimal risk based on the severity of the depressive symptoms and None.   Homework: Continue positive journal.   Plan: Follow up with Carey Bullocks, LCSW at [facility] in one week or earlier if needed.

## 2018-07-23 LAB — COMPREHENSIVE METABOLIC PANEL
ALBUMIN: 4.4 g/dL (ref 3.5–5.5)
ALK PHOS: 78 IU/L (ref 39–117)
ALT: 11 IU/L (ref 0–32)
AST: 13 IU/L (ref 0–40)
Albumin/Globulin Ratio: 1.1 — ABNORMAL LOW (ref 1.2–2.2)
BUN/Creatinine Ratio: 18 (ref 9–23)
BUN: 18 mg/dL (ref 6–24)
Bilirubin Total: 0.2 mg/dL (ref 0.0–1.2)
CO2: 18 mmol/L — AB (ref 20–29)
CREATININE: 0.98 mg/dL (ref 0.57–1.00)
Calcium: 10.5 mg/dL — ABNORMAL HIGH (ref 8.7–10.2)
Chloride: 101 mmol/L (ref 96–106)
GFR, EST AFRICAN AMERICAN: 75 mL/min/{1.73_m2} (ref >59)
GFR, EST NON AFRICAN AMERICAN: 65 mL/min/{1.73_m2} (ref >59)
GLUCOSE: 80 mg/dL (ref 65–99)
Globulin, Total: 4.1 g/dL (ref 1.5–4.5)
POTASSIUM: 4.8 mmol/L (ref 3.5–5.2)
Sodium: 137 mmol/L (ref 134–144)
Total Protein: 8.5 g/dL (ref 6.0–8.5)

## 2018-07-23 LAB — CBC
HEMOGLOBIN: 14.5 g/dL (ref 11.1–15.9)
Hematocrit: 44.6 % (ref 34.0–46.6)
MCH: 26.7 pg (ref 26.6–33.0)
MCHC: 32.5 g/dL (ref 31.5–35.7)
MCV: 82 fL (ref 79–97)
Platelets: 357 10*3/uL (ref 150–450)
RBC: 5.44 x10E6/uL — ABNORMAL HIGH (ref 3.77–5.28)
RDW: 14.5 % (ref 12.3–15.4)
WBC: 6.8 10*3/uL (ref 3.4–10.8)

## 2018-07-27 ENCOUNTER — Ambulatory Visit: Payer: Medicaid Other | Admitting: Licensed Clinical Social Worker

## 2018-07-28 ENCOUNTER — Ambulatory Visit: Payer: Medicaid Other | Admitting: Specialist

## 2018-07-29 ENCOUNTER — Ambulatory Visit: Payer: Medicaid Other | Admitting: Obstetrics and Gynecology

## 2018-07-29 VITALS — BP 132/89 | HR 81 | Temp 98.0°F | Ht 64.5 in | Wt 234.3 lb

## 2018-07-29 DIAGNOSIS — M545 Low back pain: Principal | ICD-10-CM

## 2018-07-29 DIAGNOSIS — G8929 Other chronic pain: Secondary | ICD-10-CM

## 2018-07-29 DIAGNOSIS — G4459 Other complicated headache syndrome: Secondary | ICD-10-CM

## 2018-07-29 MED ORDER — IBUPROFEN 800 MG PO TABS: 800.0000 mg | ORAL_TABLET | Freq: Three times a day (TID) | ORAL | 0 refills | Status: AC | PRN

## 2018-07-29 NOTE — Patient Instructions (Signed)
I value your feedback and entrusting us with your care. If you get a [place] patient survey, I would appreciate you taking the time to let us know about your experience today. Thank you! 

## 2018-07-29 NOTE — Progress Notes (Signed)
Patient, No Pcp Per   Chief Complaint  Patient presents with  . Leg Pain  . Headache    HPI:      Ms. Kerri Lester is a 57 y.o. No obstetric history on file. who LMP was No LMP recorded (approximate)., presents today for f/u on LBP. She was supposed to see MD this wk but appt was cancelled and rescheduled for next wk. Pt hasn't taken any NSAIDs. Has LT leg pain/burning sensation in back of leg.  Pt needs appt with neuro. Pt went off maxide for a wk to see if cause of headaches, but headaches worse now and daily. Only used to be a few times a wk. Pt concerned about not being on diuretic due to water retention sx.  Pt on remeron and followed by [employer]. Has only been on it for a wk.     Past Medical History:  Diagnosis Date  . Anemia   . Asthma   . Cataract   . Depression   . Hypertension   . Migraines     Past Surgical History:  Procedure Laterality Date  . BREAST BIOPSY Right 2010   negative  . BREAST BIOPSY Left 1978, 1984, 2010   negative  . CESAREAN SECTION     x 3    Family History  Problem Relation Age of Onset  . Asthma Son   . Hypertension Father   . Hypertension Brother     Social History   Socioeconomic History  . Marital status: Divorced    Spouse name: Not on file  . Number of children: 3  . Years of education: Not on file  . Highest education level: Associate degree: academic program  Occupational History  . Occupation: none  Social Needs  . Financial resource strain: Hard  . Food insecurity:    Worry: Often true    Inability: Often true  . Transportation needs:    Medical: Yes    Non-medical: Yes  Tobacco Use  . Smoking status: Never Smoker  . Smokeless tobacco: Never Used  Substance and Sexual Activity  . Alcohol use: Yes    Alcohol/week: 1.0 standard drinks    Types: 1 Cans of beer per week    Comment: Socially   . Drug use: No  . Sexual activity: Not on file  Lifestyle  . Physical activity:    Days per week: 0 days   Minutes per session: 0 min  . Stress: Rather much  Relationships  . Social connections:    Talks on phone: Once a week    Gets together: Once a week    Attends religious service: More than 4 times per year    Active member of club or organization: No    Attends meetings of clubs or organizations: Never    Relationship status: Divorced  . Intimate partner violence:    Fear of current or ex partner: No    Emotionally abused: No    Physically abused: No    Forced sexual activity: No  Other Topics Concern  . Not on file  Social History Narrative   Rents home, difficult to pay, relies on children to help.    Outpatient Medications Prior to Visit  Medication Sig Dispense Refill  . albuterol (PROVENTIL HFA;VENTOLIN HFA) 108 (90 Base) MCG/ACT inhaler Inhale 2 puffs into the lungs every 6 (six) hours as needed for wheezing or shortness of breath. 18 g 3  . amLODipine (NORVASC) 5 MG tablet Take 1  tablet (5 mg total) by mouth daily. 90 tablet 3  . budesonide-formoterol (SYMBICORT) 160-4.5 MCG/ACT inhaler Inhale 2 puffs into the lungs 2 (two) times daily. 10.2 Inhaler 3  . cetirizine (ZYRTEC) 10 MG tablet Take 1 tablet (10 mg total) by mouth daily. 90 tablet 3  . diclofenac (CATAFLAM) 50 MG tablet Take 1 tablet (50 mg total) by mouth as needed (1 50 mg tab PO with food every 8 hrs PRN.). 30 tablet 0  . ferrous sulfate 325 (65 FE) MG tablet Take 1 tablet (325 mg total) by mouth daily with breakfast. 30 tablet 3  . FLUoxetine (PROZAC) 20 MG tablet Take 1 tablet (20 mg total) by mouth daily. 90 tablet 3  . fluticasone (VERAMYST) 27.5 MCG/SPRAY nasal spray Place 2 sprays into the nose daily. 10 g 3  . mirtazapine (REMERON) 15 MG tablet 1/2 tablet at night time 1 week then 1 tablet nightly. 30 tablet 0  . montelukast (SINGULAIR) 10 MG tablet Take 1 tablet (10 mg total) by mouth at bedtime. Reported on 04/03/2016 90 tablet 3  . omeprazole (PRILOSEC) 20 MG capsule Take 2 capsules (40 mg total) by mouth  daily. 90 capsule 3  . triamterene-hydrochlorothiazide (MAXZIDE-25) 37.5-25 MG tablet Take 0.5 tablets by mouth daily. 90 tablet 3   No facility-administered medications prior to visit.       ROS:  Review of Systems  Constitutional: Negative for fatigue and fever.  Cardiovascular: Negative for chest pain and palpitations.  Musculoskeletal: Positive for arthralgias and back pain.  Neurological: Positive for headaches. Negative for numbness.     OBJECTIVE:   Vitals:  BP 132/89   Pulse 81   Temp 98 F (36.7 C)   Ht 5' 4.5" (1.638 m)   Wt 234 lb 4.8 oz (106.3 kg)   LMP  (Approximate) Comment: 1.5-2 years ago  BMI 39.60 kg/m   Physical Exam  Constitutional: She is oriented to person, place, and time. She appears well-developed.  Neck: Normal range of motion.  Pulmonary/Chest: Effort normal.  Musculoskeletal: Normal range of motion.  Neurological: She is alert and oriented to person, place, and time. No cranial nerve deficit.  Psychiatric: She has a normal mood and affect. Her behavior is normal. Judgment and thought content normal.  Vitals reviewed.   Assessment/Plan: Chronic bilateral low back pain without sciatica - Rx ibup 800 mg TID. Pt has upcoming appt wiht MD. - Plan: ibuprofen (ADVIL,MOTRIN) 800 MG tablet  Other complicated headache syndrome - Refer to neuro for HA eval. Can resume maxide. - Plan: Ambulatory referral to Neurology    Meds ordered this encounter  Medications  . ibuprofen (ADVIL,MOTRIN) 800 MG tablet    Sig: Take 1 tablet (800 mg total) by mouth every 8 (eight) hours as needed.    Dispense:  30 tablet    Refill:  0    Order Specific Question:   Supervising Provider    Answer:   Nadara MustardHARRIS, ROBERT P [[id number]]      Return if symptoms worsen or fail to improve.  Jazier Mcglamery B. Remell Giaimo, PA-C 07/29/2018 8:12 PM

## 2018-08-04 ENCOUNTER — Ambulatory Visit: Payer: Medicaid Other | Admitting: Specialist

## 2018-08-04 DIAGNOSIS — M545 Low back pain, unspecified: Secondary | ICD-10-CM

## 2018-08-04 NOTE — Progress Notes (Signed)
   Subjective:    Patient ID: Kerri Lester, female    DOB: 08-23-1961, 57 y.o.   MRN: [id number]  HPI  Several year history of LBP. For the past several months she has had tightness and burning in her LEs. She would not walk for pleasure. She no longer works because of other medical problems. She is able to do her housework but in shorter segments. She has been on Diclofenac with no benefit.   She does not have diabetes or thyroid disease.    Review of Systems     Objective:   Physical Exam  Her gait is normal. She is able to heel/toe walk. She is able to do 50% of a squat and rises in a monophasic fashion. On inspection, her shoulders and pelvis are level. She has increased lumbar lordosis, most probably on a genetic basis.   She is able to march in place with normal muscle recruitment and relaxation. She is able to stand on each leg independently with a negative Trendelenburg sign.   DTRs 2+ = at knees and ankles. Toe signs are downward. SENS is normal. MMT is 5/5. ROM full.     Assessment & Plan:   Plan X-rays and Return in 2 weeks.   Impression: Low back pain probably DDD based on age and genetics.

## 2018-08-09 ENCOUNTER — Ambulatory Visit: Payer: Medicaid Other | Admitting: Pharmacy Technician

## 2018-08-09 NOTE — Progress Notes (Signed)
Patient scheduled for eligibility appointment at [facility].  Patient did not show for the appointment on August 09, 2018 at10:30a..m.  Patient did not reschedule eligibility appointment.  [facility] to provide [facility] until eligibility is determined.  Kerri DacostaBetty J. Zeola Lester Care Manager [facility]

## 2018-08-10 ENCOUNTER — Telehealth: Payer: Self-pay | Admitting: Adult Health Nurse Practitioner

## 2018-08-10 ENCOUNTER — Ambulatory Visit: Payer: Medicaid Other | Admitting: Licensed Clinical Social Worker

## 2018-08-10 ENCOUNTER — Telehealth: Payer: Self-pay | Admitting: Licensed Clinical Social Worker

## 2018-08-10 NOTE — Telephone Encounter (Signed)
Patient called and apologized for missing her appointment. She said that she would like to reschedule soon because she is in "a bad place"

## 2018-08-10 NOTE — Telephone Encounter (Signed)
Clinician reached out to the client regarding that missing her follow up appointment @ noon today and left a message requesting her to call back to reschedule her appointment.

## 2018-08-16 ENCOUNTER — Ambulatory Visit: Payer: Medicaid Other | Admitting: Pharmacy Technician

## 2018-08-16 DIAGNOSIS — Z79899 Other long term (current) drug therapy: Secondary | ICD-10-CM

## 2018-08-16 NOTE — Progress Notes (Signed)
  Completed [facility] application and contract.  Patient agreed to all terms of the [facility] contract.    Patient to provide notarized letter of support and Medicaid Denial Letter.  Provided patient with community resource material based on her particular needs.    Symbicort Prescription Application completed with patient.  Forwarded to [facility] for signature.  Explained to patient that [employer] would not send medication without Medicaid Denial Letter.  Patient acknowledged that she understood.  Referral for MTM.  Upon receipt of signed application from provider, Symbicort Prescription Application will be submitted to [employer].  Sherilyn Dacosta Care Manager [facility]

## 2018-08-17 ENCOUNTER — Telehealth: Payer: Self-pay | Admitting: Licensed Clinical Social Worker

## 2018-08-17 NOTE — Telephone Encounter (Signed)
Clinician contacted patient about rescheduling her last missed appointment and left voicemail with call back number.

## 2018-08-18 ENCOUNTER — Ambulatory Visit: Payer: Medicaid Other | Admitting: Specialist

## 2018-09-07 ENCOUNTER — Telehealth: Payer: Self-pay | Admitting: Licensed Clinical Social Worker

## 2018-09-07 NOTE — Telephone Encounter (Signed)
Clinician sent email to the patient to check in, see how she was doing, and contact about scheduling appointment due to unsuccessful attempts to reach the patient via phone.

## 2018-09-20 ENCOUNTER — Other Ambulatory Visit: Payer: Self-pay | Admitting: Adult Health Nurse Practitioner

## 2018-09-20 ENCOUNTER — Ambulatory Visit: Payer: Medicaid Other | Admitting: Pharmacist

## 2018-09-20 ENCOUNTER — Other Ambulatory Visit: Payer: Self-pay

## 2018-09-20 ENCOUNTER — Encounter: Payer: Self-pay | Admitting: Pharmacist

## 2018-09-20 VITALS — BP 144/82 | Wt 237.5 lb

## 2018-09-20 DIAGNOSIS — G8929 Other chronic pain: Secondary | ICD-10-CM

## 2018-09-20 DIAGNOSIS — M545 Low back pain: Principal | ICD-10-CM

## 2018-09-20 DIAGNOSIS — Z79899 Other long term (current) drug therapy: Secondary | ICD-10-CM

## 2018-09-20 NOTE — Progress Notes (Signed)
[facility] Visit Note  Patient: Kerri Lester MRN: [id number] Date of Birth: 1961/04/29 PCP: Patient, No Pcp Per   Fonnie Jarvis 57 y.o. female presents for a medication therapy management visit today.  BP (!) 144/82 (BP Location: Right Arm, Patient Position: Sitting, Cuff Size: Normal)   Wt 237 lb 8 oz (107.7 kg)   BMI 40.14 kg/m   Patient Information   Past Medical History:  Diagnosis Date  . Anemia   . Asthma   . Cataract   . Depression   . Hypertension   . Migraines       Past Surgical History:  Procedure Laterality Date  . BREAST BIOPSY Right 2010   negative  . BREAST BIOPSY Left 1978, 1984, 2010   negative  . CESAREAN SECTION     x 3  . COLON SURGERY    . SMALL INTESTINE SURGERY       Family History  Problem Relation Age of Onset  . Asthma Son   . Hypertension Father   . Hypertension Brother   . Drug abuse Sister      Family Support: Good  Lifestyle Diet: Breakfast: usually does not eat breakfast; sometimes has bacon and egg or oatmeal and sausage Lunch: if pt eats breakfast she does not eat lunch; sandwich Dinner: pasta; salad; meat and vegetable Drinks: water, soda or tea occasionally     Current Exercise Habits: The patient does not participate in regular exercise at present       Social History   Substance and Sexual Activity  Alcohol Use Yes  . Alcohol/week: 1.0 standard drinks  . Types: 1 Cans of beer per week   Comment: Socially       Social History   Tobacco Use  Smoking Status Never Smoker  Smokeless Tobacco Never Used      Health Maintenance  Topic Date Due  . Hepatitis C Screening  09/28/63  . PNEUMOCOCCAL POLYSACCHARIDE VACCINE AGE 69-64 HIGH RISK  04/12/1965  . FOOT EXAM  04/12/1973  . OPHTHALMOLOGY EXAM  04/12/1973  . URINE MICROALBUMIN  04/12/1973  . HIV Screening  04/12/1978  . TETANUS/TDAP  04/12/1982  . COLONOSCOPY  04/12/2013  . MAMMOGRAM  05/15/2017  . PAP SMEAR  05/15/2018  .  INFLUENZA VACCINE  06/17/2018  . HEMOGLOBIN A1C  12/18/2018   Outpatient Encounter Medications as of 09/20/2018  Medication Sig  . albuterol (PROVENTIL HFA;VENTOLIN HFA) 108 (90 Base) MCG/ACT inhaler Inhale 2 puffs into the lungs every 6 (six) hours as needed for wheezing or shortness of breath.  Marland Kitchen amLODipine (NORVASC) 5 MG tablet Take 1 tablet (5 mg total) by mouth daily.  . budesonide-formoterol (SYMBICORT) 160-4.5 MCG/ACT inhaler Inhale 2 puffs into the lungs 2 (two) times daily.  . cetirizine (ZYRTEC) 10 MG tablet Take 1 tablet (10 mg total) by mouth daily.  . ferrous sulfate 325 (65 FE) MG tablet Take 1 tablet (325 mg total) by mouth daily with breakfast.  . FLUoxetine (PROZAC) 20 MG tablet Take 1 tablet (20 mg total) by mouth daily.  . fluticasone (VERAMYST) 27.5 MCG/SPRAY nasal spray Place 2 sprays into the nose daily.  Marland Kitchen ibuprofen (ADVIL,MOTRIN) 800 MG tablet Take 1 tablet (800 mg total) by mouth every 8 (eight) hours as needed.  . mirtazapine (REMERON) 15 MG tablet 1/2 tablet at night time 1 week then 1 tablet nightly.  . montelukast (SINGULAIR) 10 MG tablet Take 1 tablet (10 mg total) by mouth at bedtime. Reported on 04/03/2016  .  omeprazole (PRILOSEC) 20 MG capsule Take 2 capsules (40 mg total) by mouth daily.  Marland Kitchen triamterene-hydrochlorothiazide (MAXZIDE-25) 37.5-25 MG tablet Take 0.5 tablets by mouth daily.  . diclofenac (CATAFLAM) 50 MG tablet Take 1 tablet (50 mg total) by mouth as needed (1 50 mg tab PO with food every 8 hrs PRN.). (Patient not taking: Reported on 09/20/2018)   No facility-administered encounter medications on file as of 09/20/2018.    Health Maintenance/Date Completed  Last ED visit: August Last Visit to PCP: September Next Visit to PCP: No appointment at this time; needs x-ray of back Specialist Visit: na Dental Exam: several years Eye Exam: June Prostate Exam: na Pelvic/PAP Exam: 2017 Mammogram: 2017 DEXA: na Colonoscopy: never Flu Vaccine: last  year Pneumonia Vaccine: September 2018  Assessment and Plan:  Patient reports having been out of her medications for some time while she was waiting to get an appointment. We are filling all of her medications for her today.  Asthma: Well controlled when pt takes her medications and inhalers. Needs Symbicort refilled which we are getting her today.  Anemia: Pt takes ferrous sulfate; managed.  Depression: Pt takes Prozac; still struggles with depression but reports it fluctuates. Feels guilt for having to depend on/live with son. Reports feeling less independent and has to force herself to even get out of bed a lot of days. Pt was seeing a therapist through [facility] but has not been able to make an appointment with them within the past month; wants to get started back in that. Pt was started on mirtazapine but has been out of her medications.  HTN: Pt reports blood pressure fluctuates. Spikes due to pain. Pt currently on amlodipine and triamterene/HCTZ. Reports swelling in her legs. Discussed that this could be a side effect of the amlodipine and that she should follow up with her doctor to discuss options.  Migraines: Takes ibuprofen as needed. Pt reports still struggling with this; reports having them daily some weeks. Has never been on anything for prophylaxis. Discussed with patient about talking to doctor about possibly starting a prophylactic medication.  Return to clinic in approximately 6 months.  Angeline Slim PharmD Candidate [employer]

## 2018-09-21 ENCOUNTER — Telehealth: Payer: Self-pay | Admitting: Pharmacy Technician

## 2018-09-21 NOTE — Telephone Encounter (Signed)
I left voicemail for patient to call clinic to schedule an appt with Heather. Offered appt for this afternoon or Thursday.

## 2018-09-21 NOTE — Telephone Encounter (Signed)
Patient supported by son-Brandon.  Patient provided 2018 tax return from [place].  Patient still needs to provide last 30 days of pay stubs from [place].  [facility] unable to provide medication assistance until Brandon's pay stubs are provided.  Sherilyn Dacosta Care Manager [facility]

## 2018-10-05 ENCOUNTER — Encounter: Payer: Self-pay | Admitting: Neurology

## 2018-10-05 ENCOUNTER — Ambulatory Visit: Payer: Medicaid Other | Admitting: Neurology

## 2018-10-05 ENCOUNTER — Telehealth: Payer: Self-pay | Admitting: Neurology

## 2018-10-05 ENCOUNTER — Other Ambulatory Visit: Payer: Self-pay

## 2018-10-05 DIAGNOSIS — G4489 Other headache syndrome: Secondary | ICD-10-CM

## 2018-10-05 MED ORDER — TOPIRAMATE 25 MG PO TABS: ORAL_TABLET | ORAL | 3 refills | Status: DC

## 2018-10-05 NOTE — Telephone Encounter (Signed)
lvm for pt to be aware I gave her their number of [phone] and to give them a call if she has not heard in the next 2-3 business days.

## 2018-10-05 NOTE — Telephone Encounter (Signed)
Medicaid order sent to GI. They obtain the auth and will reach out to the pt to schedule.  °

## 2018-10-05 NOTE — Patient Instructions (Signed)
We will start Topamax for the headache.   Topamax (topiramate) is a seizure medication that has an FDA approval for seizures and for migraine headache. Potential side effects of this medication include weight loss, cognitive slowing, tingling in the fingers and toes, and carbonated drinks will taste bad. If any significant side effects are noted on this drug, please contact our office.  

## 2018-10-05 NOTE — Progress Notes (Signed)
Reason for visit: Headache  Referring physician: Dr. Lorita Officer is a 57 y.o. female  History of present illness:  Ms. Hintze is a 57 year old right-handed black female with a history of migraine headaches with onset around age 35.  The patient claims that she had a lot of headaches early on in her life but then the headaches seemed to go away.  The patient has had recurrence of her headaches with some high frequency over the last 2 years.  The patient had a syncopal episode around 28 May 2018, following this event she has had daily headaches.  In the past, she has been told she has pseudotumor cerebri, she was treated with Diamox in the past, but she lost her medical insurance and she stopped the Diamox.  The patient claims that her headaches are mainly bifrontal in nature, they are sometimes associated with some nausea but no vomiting.  She denies any photophobia or phonophobia.  She denies any muffled hearing or neck stiffness.  She may take some Excedrin Migraine with some benefit.  She drinks tea and soft drinks during the day, she has started to drink more water recently.  The patient reports no visual changes.  She has noted over the last several months that she has had some gait instability that will come and go, this started after the syncopal event.  She does report some dizziness at times.  She denies any further blackouts.  She is gaining weight even though she is not eating a lot.  The patient has been seen by her ophthalmologist, they have not mentioned that she is developing papilledema.  The patient comes to this office for an evaluation.  Past Medical History:  Diagnosis Date  . Anemia   . Asthma   . Cataract   . Depression   . Hypertension   . Migraines     Past Surgical History:  Procedure Laterality Date  . BREAST BIOPSY Right 2010   negative  . BREAST BIOPSY Left 1978, 1984, 2010   negative  . CESAREAN SECTION     x 3  . COLON SURGERY    . SMALL  INTESTINE SURGERY      Family History  Problem Relation Age of Onset  . Asthma Son   . Hypertension Father   . Cirrhosis Father   . Alcohol abuse Father   . Hypertension Brother   . Diabetes Brother   . Hypertension Mother   . Drug abuse Sister     Social history:  reports that she has never smoked. She has never used smokeless tobacco. She reports that she drinks about 1.0 standard drinks of alcohol per week. She reports that she does not use drugs.  Medications:  Prior to Admission medications   Medication Sig Start Date End Date Taking? Authorizing Provider  albuterol (PROVENTIL HFA;VENTOLIN HFA) 108 (90 Base) MCG/ACT inhaler Inhale 2 puffs into the lungs every 6 (six) hours as needed for wheezing or shortness of breath. 06/17/18  Yes McGowan, Carollee Herter A, PA-C  amLODipine (NORVASC) 5 MG tablet Take 1 tablet (5 mg total) by mouth daily. 06/17/18  Yes McGowan, Carollee Herter A, PA-C  budesonide-formoterol (SYMBICORT) 160-4.5 MCG/ACT inhaler Inhale 2 puffs into the lungs 2 (two) times daily. 06/17/18  Yes McGowan, Carollee Herter A, PA-C  cetirizine (ZYRTEC) 10 MG tablet Take 1 tablet (10 mg total) by mouth daily. 06/17/18  Yes McGowan, Carollee Herter A, PA-C  ferrous sulfate 325 (65 FE) MG tablet Take 1 tablet (  325 mg total) by mouth daily with breakfast. 07/22/18  Yes Doles-Johnson, Teah, NP  FLUoxetine (PROZAC) 20 MG tablet Take 1 tablet (20 mg total) by mouth daily. 07/01/18  Yes McGowan, Carollee Herter A, PA-C  fluticasone (VERAMYST) 27.5 MCG/SPRAY nasal spray Place 2 sprays into the nose daily. 06/17/18  Yes McGowan, Carollee Herter A, PA-C  ibuprofen (ADVIL,MOTRIN) 800 MG tablet Take 1 tablet (800 mg total) by mouth every 8 (eight) hours as needed. 07/29/18  Yes Copland, Alicia B, PA-C  mirtazapine (REMERON) 15 MG tablet 1/2 tablet at night time 1 week then 1 tablet nightly. 07/22/18  Yes Doles-Johnson, Teah, NP  montelukast (SINGULAIR) 10 MG tablet Take 1 tablet (10 mg total) by mouth at bedtime. Reported on 04/03/2016 06/17/18  Yes  McGowan, Carollee Herter A, PA-C  omeprazole (PRILOSEC) 20 MG capsule Take 2 capsules (40 mg total) by mouth daily. 06/17/18  Yes McGowan, Carollee Herter A, PA-C  triamterene-hydrochlorothiazide (MAXZIDE-25) 37.5-25 MG tablet Take 0.5 tablets by mouth daily. 06/17/18  Yes McGowan, Carollee Herter A, PA-C  diclofenac (CATAFLAM) 50 MG tablet Take 1 tablet (50 mg total) by mouth as needed (1 50 mg tab PO with food every 8 hrs PRN.). Patient not taking: Reported on 10/05/2018 06/17/18   Michiel Cowboy A, PA-C     No Known Allergies  ROS:  Out of a complete 14 system review of symptoms, the patient complains only of the following symptoms, and all other reviewed systems are negative.  Headache Chronic low back pain  Blood pressure (!) 158/99, pulse 83, resp. rate 18, height 5' 4.5" (1.638 m), weight 240 lb (108.9 kg).  Physical Exam  General: The patient is alert and cooperative at the time of the examination.  The patient is markedly obese.  Eyes: Pupils are equal, round, and reactive to light. Discs are flat, no definite papilledema was noted.  Venous pulsations are not noted.  Neck: The neck is supple, no carotid bruits are noted.  Respiratory: The respiratory examination is clear.  Cardiovascular: The cardiovascular examination reveals a regular rate and rhythm, no obvious murmurs or rubs are noted.  Neuromuscular: Range of movement the cervical spine is full.  No crepitus is noted in the temporomandibular joints.  Skin: Extremities are without significant edema.  Neurologic Exam  Mental status: The patient is alert and oriented x 3 at the time of the examination. The patient has apparent normal recent and remote memory, with an apparently normal attention span and concentration ability.  Cranial nerves: Facial symmetry is present. There is good sensation of the face to pinprick and soft touch bilaterally. The strength of the facial muscles and the muscles to head turning and shoulder shrug are normal  bilaterally. Speech is well enunciated, no aphasia or dysarthria is noted. Extraocular movements are full. Visual fields are full. The tongue is midline, and the patient has symmetric elevation of the soft palate. No obvious hearing deficits are noted.  Motor: The motor testing reveals 5 over 5 strength of all 4 extremities. Good symmetric motor tone is noted throughout.  Sensory: Sensory testing is intact to pinprick, soft touch, vibration sensation, and position sense on all 4 extremities. No evidence of extinction is noted.  Coordination: Cerebellar testing reveals good finger-nose-finger and heel-to-shin bilaterally.  Gait and station: Gait is normal. Tandem gait is normal. Romberg is negative. No drift is seen.  Reflexes: Deep tendon reflexes are symmetric and normal bilaterally. Toes are downgoing bilaterally.   Assessment/Plan:  1.  Chronic daily headache  2.  History  of pseudotumor cerebri  3.  Recent episode of syncope  4.  Episodic gait instability  Given the recent events with onset of headache, gait instability, and syncope, the patient will undergo MRI of the brain.  The patient will be placed on Topamax, if the medication does not appear to be effective in helping her headaches, we will consider a lumbar puncture in the near future.  The patient otherwise will follow-up in 3 months.  She will call for dose adjustments of the Topamax.  Marlan Palau. Keith Jamaia Brum MD 10/05/2018 11:49 AM  [facility] [address] [place], [place] [id number]  Phone [phone] Fax [phone]

## 2018-11-11 ENCOUNTER — Telehealth: Payer: Self-pay | Admitting: Pharmacy Technician

## 2019-01-05 ENCOUNTER — Telehealth: Payer: Self-pay | Admitting: Neurology

## 2019-01-05 NOTE — Telephone Encounter (Signed)
I contacted the pt and left a vm. I advised the pt if she would like to schedule a r/v we could schedule, but I also provided # to [facility] ([phone]) and instructed to call if she would like to proceed with MRI being completed. Pt advised the order Dr. Anne Hahn placed on 10/05/18 is still current and could be completed. Pt advised to call back and advise how she would like proceed.

## 2019-01-05 NOTE — Telephone Encounter (Signed)
Appointment Request From: Fonnie Jarvis    With Provider: York Spaniel, MD [[facility]]    Preferred Date Range: Any    Preferred Times: Any time    Reason for visit: Request an Appointment    Comments:  Increase of my headaches. Vision has spots and some sensitivity. lack of sleep. Never was scheduled for my MRI

## 2019-01-10 ENCOUNTER — Ambulatory Visit
Admission: RE | Admit: 2019-01-10 | Discharge: 2019-01-10 | Disposition: A | Discharge status: Home / Self Care | Payer: Disability Insurance | Source: Ambulatory Visit | Attending: Pediatrics | Admitting: Pediatrics

## 2019-01-10 ENCOUNTER — Other Ambulatory Visit: Payer: Self-pay | Admitting: Pediatrics

## 2019-01-10 DIAGNOSIS — M545 Low back pain, unspecified: Secondary | ICD-10-CM

## 2019-01-14 IMAGING — US US THYROID
1 series · 14 of 25 positions shown · non-contrast
Comparison: None.

CLINICAL DATA: 57-year-old female

EXAM:
THYROID ULTRASOUND
TECHNIQUE: Ultrasound examination of the thyroid gland and adjacent soft
tissues was performed.

[Series 1: us thyroid · 14 of 43 slices shown]
[im 1/43]
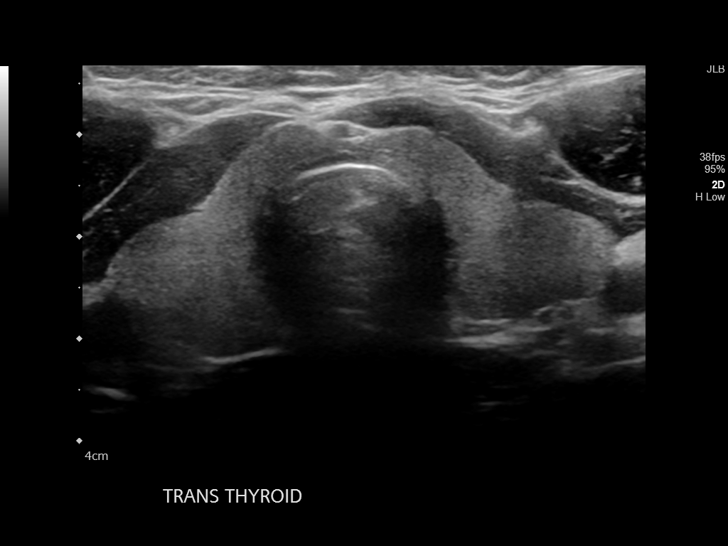
[im 4/43]
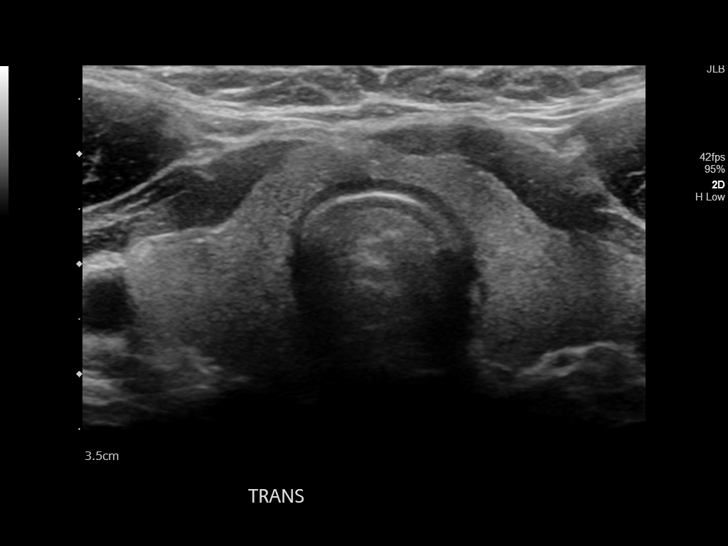
[im 8/43]
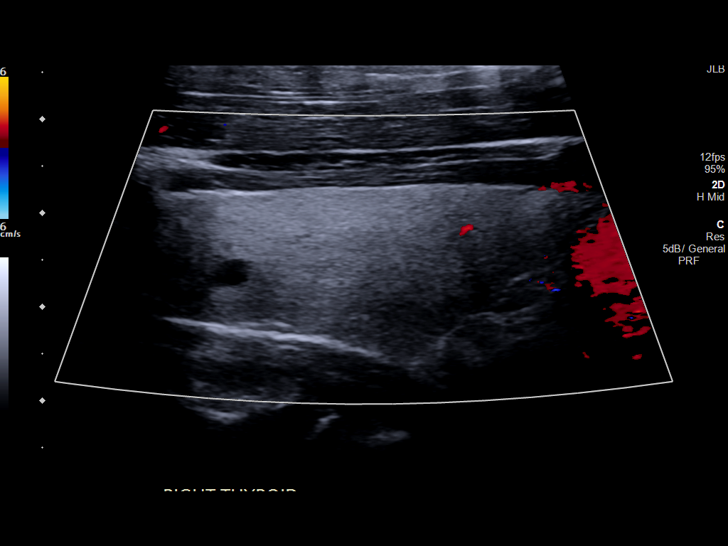
[im 11/43]
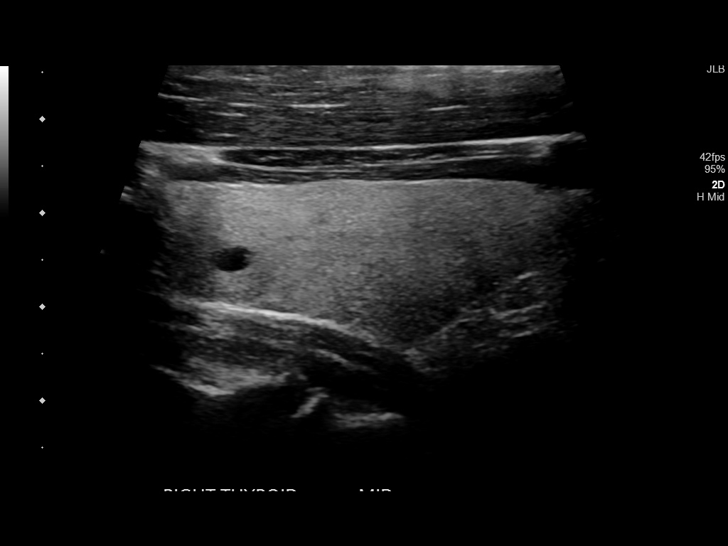
[im 15/43]
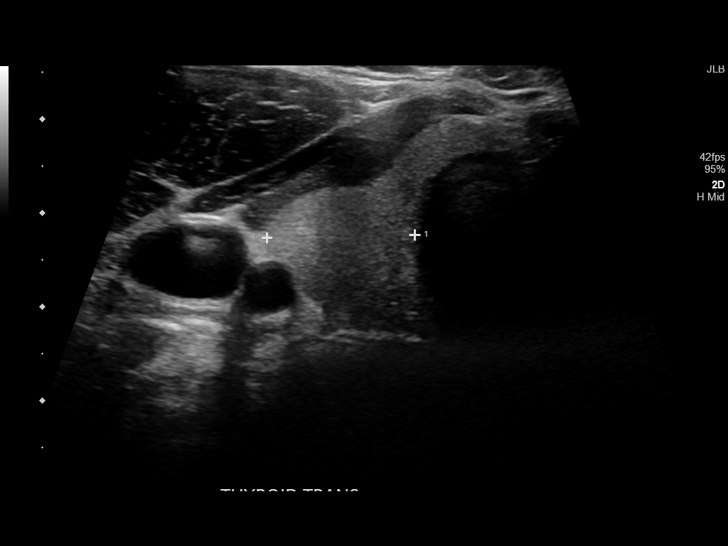
[im 16/43]
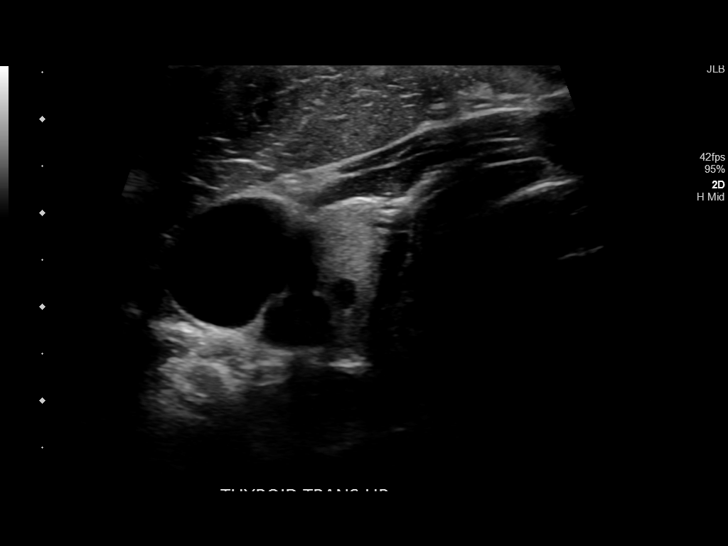
[im 20/43]
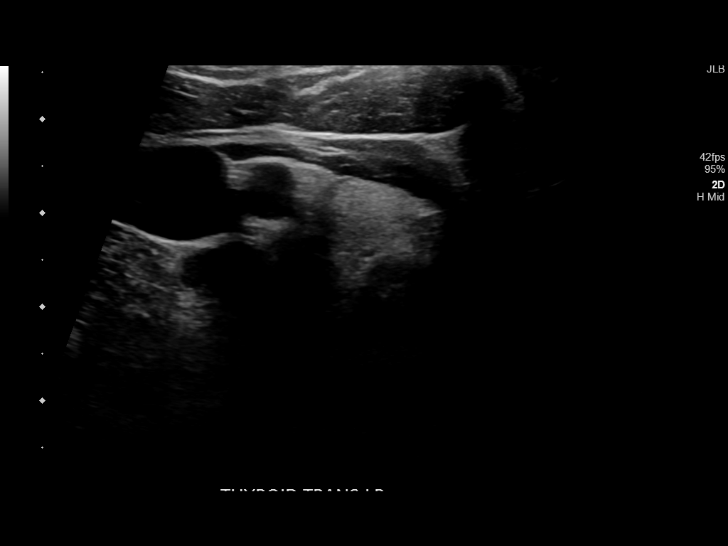
[im 23/43]
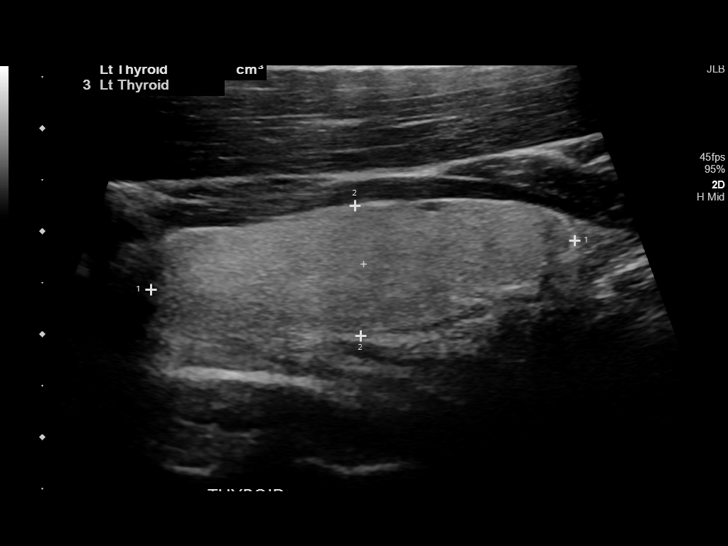
[im 27/43]
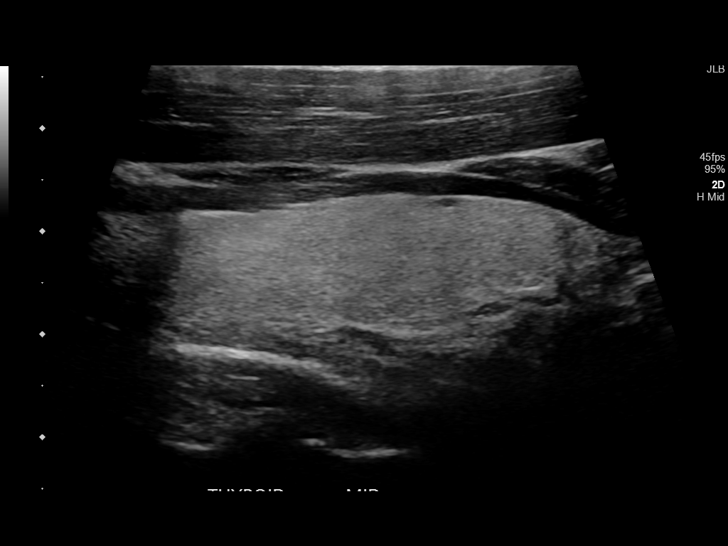
[im 29/43]
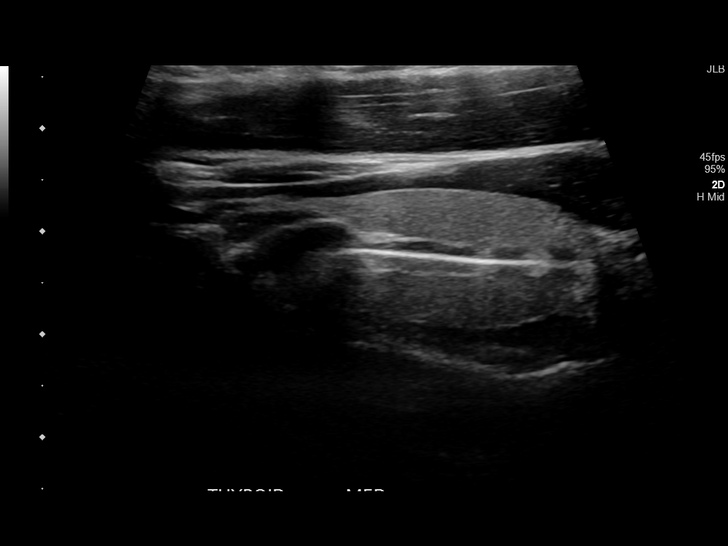
[im 32/43]
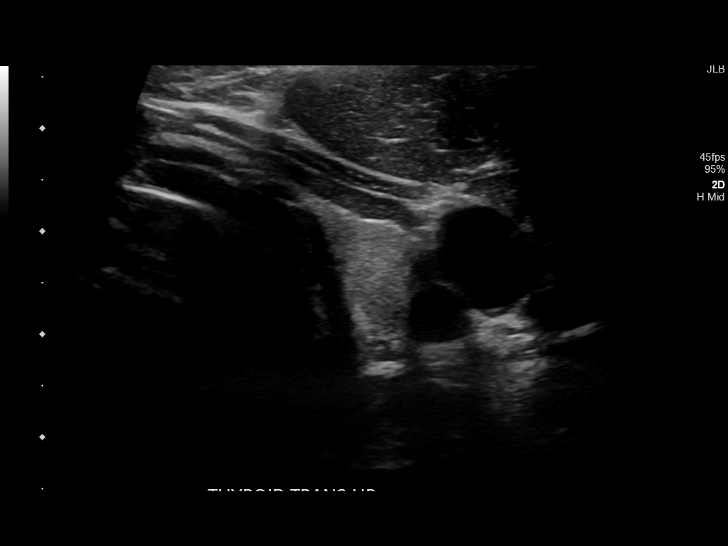
[im 36/43]
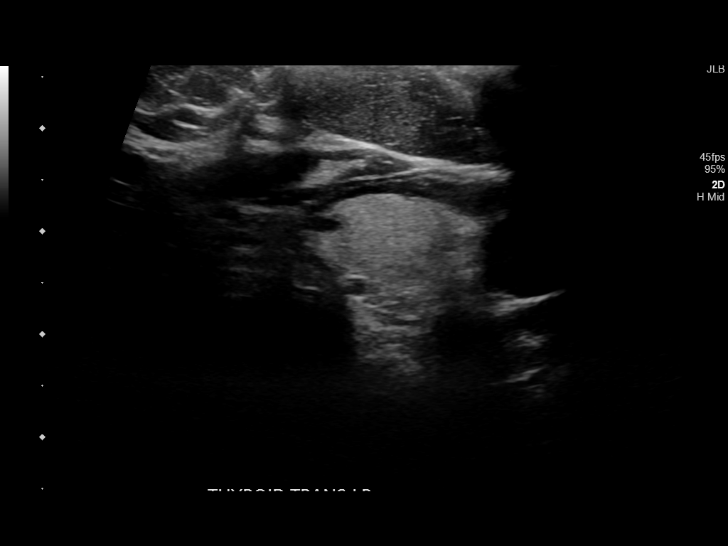
[im 39/43]
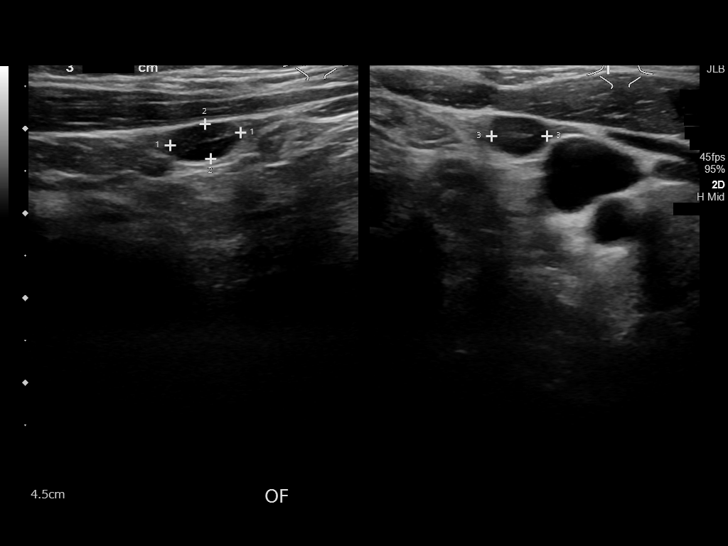
[im 43/43]
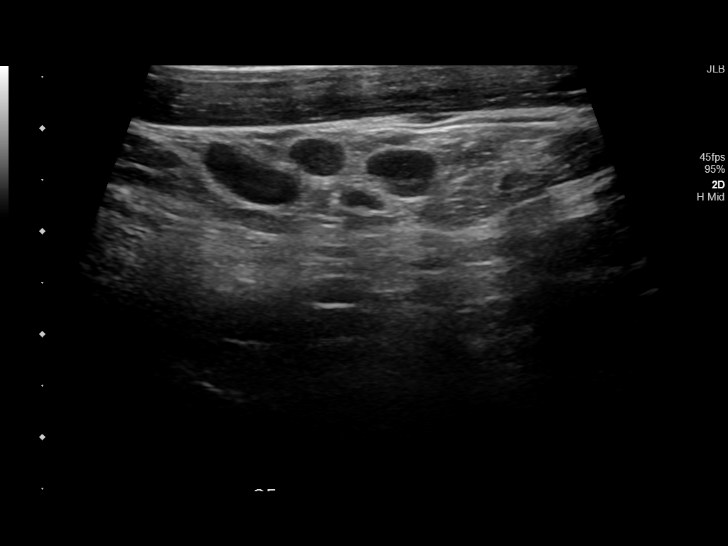

[14 of 25 positions shown; findings below may reference images not displayed]

FINDINGS: Parenchymal Echotexture: Normal

Isthmus: 0.4 cm

Right lobe: 4.7 cm x 1.7 cm x 1.6 cm

Left lobe: 4.1 cm x 1.3 cm x 1.5 cm

_________________________________________________________

Estimated total number of nodules >/= 1 cm: 0

Number of spongiform nodules >/=  2 cm not described below (TR1): 0

Number of mixed cystic and solid nodules >/= 1.5 cm not described
below (TR2): 0

_________________________________________________________

Benign cyst of the upper right thyroid.

Lymph nodes are present which all demonstrate typical architecture.
IMPRESSION: Unremarkable survey of the thyroid.

Typical appearing lymph nodes, potentially reactive.

## 2019-02-10 DIAGNOSIS — Z0271 Encounter for disability determination: Secondary | ICD-10-CM

## 2019-04-14 ENCOUNTER — Ambulatory Visit: Payer: Self-pay | Admitting: Ophthalmology

## 2019-05-05 ENCOUNTER — Ambulatory Visit: Payer: Self-pay | Admitting: Ophthalmology

## 2019-06-04 ENCOUNTER — Ambulatory Visit
Admission: RE | Admit: 2019-06-04 | Discharge: 2019-06-04 | Disposition: A | Discharge status: Home / Self Care | Payer: Disability Insurance | Source: Ambulatory Visit | Attending: Neurology | Admitting: Neurology

## 2019-06-04 ENCOUNTER — Other Ambulatory Visit: Payer: Self-pay

## 2019-06-04 DIAGNOSIS — G4489 Other headache syndrome: Secondary | ICD-10-CM | POA: Diagnosis not present

## 2019-06-04 MED ORDER — GADOBENATE DIMEGLUMINE 529 MG/ML IV SOLN
20.0000 mL | Freq: Once | INTRAVENOUS | Status: AC | PRN
  Administered 2019-06-04: 11:00:00 20 mL via INTRAVENOUS

## 2019-06-04 NOTE — Progress Notes (Signed)
Patient ID: Kerri Lester, female   DOB: 1961-02-02, 58 y.o.   MRN: [id number] I was called by the technologist to assess the patient for a potential contrast reaction.   The patient described discomfort/irritation in the left periorbital region after receiving IV contrast.   The patient was not in any distress.   On visual inspection, there is mild left periorbital soft tissue swelling is a well as some scleral erythema on the left.   Lung fields were clear bilaterally and no upper airway sounds were present.  Patient denied chest pain or shortness of breath.   The patient had a systolic blood pressure of about 180 on initial assessment but noted a history of hypertension.  She did not take her hypertension medication this morning.  Normal pulse.  Pulse oximetry in the 98-100 range consistently.   Assuming a mild allergic or andioedema reaction, I administered the patient 50 mg of oral Benadryl.  After 30 minutes observation time, the swelling have mildly improved as had the scleral erythema.  The patient noted no significant symptoms.  The patient's systolic blood pressure reduced to about 160 which is probably near her baseline given the situation.   At this point I felt that it was safe for the patient's son to drive her home.  We discussed the importance of her immediately calling 911 if she experiences any shortness of breath, abnormal sensation in the throat, chest pain, or other significant symptoms.  I also instructed her to take her normal blood pressure medication.  She agreed to engage emergency services in the unlikely event of new symptoms.

## 2019-06-05 ENCOUNTER — Telehealth: Payer: Self-pay | Admitting: Neurology

## 2019-06-05 NOTE — Telephone Encounter (Signed)
I called the patient.  MRI of the brain shows mild white matter changes, the patient does have a history of migraine previously and a history of hypertension.  Nothing obvious that would be creating severe headaches.  If the headaches do not abate with Topamax, we may consider lumbar puncture in the future.   MRI brain 06/04/19:  IMPRESSION: Abnormal MRI scan of the brain showing bilateral supratentorial tiny nonspecific white matter hyperintensities with the differential discussed above.  No enhancing lesions are noted.

## 2019-06-06 ENCOUNTER — Telehealth: Payer: Self-pay | Admitting: Student

## 2019-06-06 NOTE — Telephone Encounter (Signed)
Patient with contrast extravasation vs. Allergic reaction during MRI on Saturday.  PA called this AM for follow-up.  Patient states she still has a small amount of discomfort in her elbow, but no pain.  Using her arm as normal.  No difficulty with ROM.  No redness, swelling, or blisters. Not requiring pain medication, not using ice/heat.  Encourage conservative measures for comfort as needed. Patient knows to call with questions or concerns.   Brynda Greathouse, MS RD PA-C 9:14 AM

## 2019-06-07 ENCOUNTER — Other Ambulatory Visit: Payer: Self-pay

## 2019-06-07 MED ORDER — TOPIRAMATE 25 MG PO TABS: ORAL_TABLET | ORAL | 3 refills | Status: AC
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
## 2020-02-25 ENCOUNTER — Ambulatory Visit: Payer: Disability Insurance | Attending: Internal Medicine

## 2020-02-25 DIAGNOSIS — Z23 Encounter for immunization: Secondary | ICD-10-CM

## 2020-02-25 NOTE — Progress Notes (Signed)
   Covid-19 Vaccination Clinic  Name:  Kerri Lester    MRN: [id number] DOB: 1961/08/15  02/25/2020  [name] was observed post Covid-19 immunization for 15 minutes without incident. She was provided with Vaccine Information Sheet and instruction to access the V-Safe system.   [name] was instructed to call 911 with any severe reactions post vaccine: Marland Kitchen Difficulty breathing  . Swelling of face and throat  . A fast heartbeat  . A bad rash all over body  . Dizziness and weakness   Immunizations Administered    Name Date Dose VIS Date Route   Pfizer COVID-19 Vaccine 02/25/2020  8:22 AM 0.3 mL 10/28/2019 Intramuscular   Manufacturer: [employer], [employer]   Lot: [id number]   NDC: [address]-7

## 2020-03-20 ENCOUNTER — Ambulatory Visit: Payer: Disability Insurance | Attending: Internal Medicine

## 2020-03-20 DIAGNOSIS — Z23 Encounter for immunization: Secondary | ICD-10-CM

## 2020-03-20 NOTE — Progress Notes (Signed)
   Covid-19 Vaccination Clinic  Name:  Kerri Lester    MRN: [id number] DOB: Jul 13, 1961  03/20/2020  Ms. [name] was observed post Covid-19 immunization for 15 minutes without incident. She was provided with Vaccine Information Sheet and instruction to access the V-Safe system.   Ms. [name] was instructed to call 911 with any severe reactions post vaccine: Marland Kitchen Difficulty breathing  . Swelling of face and throat  . A fast heartbeat  . A bad rash all over body  . Dizziness and weakness   Immunizations Administered    Name Date Dose VIS Date Route   Pfizer COVID-19 Vaccine 03/20/2020 11:13 AM 0.3 mL 01/11/2019 Intramuscular   Manufacturer: [employer], [employer]   Lot: [id number]   NDC: [address]-0

## 2020-06-08 DIAGNOSIS — K219 Gastro-esophageal reflux disease without esophagitis: Secondary | ICD-10-CM | POA: Insufficient documentation

## 2020-06-08 DIAGNOSIS — E785 Hyperlipidemia, unspecified: Secondary | ICD-10-CM | POA: Insufficient documentation

## 2020-06-08 DIAGNOSIS — J4520 Mild intermittent asthma, uncomplicated: Secondary | ICD-10-CM | POA: Insufficient documentation

## 2020-09-13 DIAGNOSIS — N950 Postmenopausal bleeding: Secondary | ICD-10-CM | POA: Insufficient documentation

## 2021-02-05 NOTE — Telephone Encounter (Signed)
To close the telephone encounter 

## 2022-03-17 ENCOUNTER — Other Ambulatory Visit: Payer: Self-pay | Admitting: Physician Assistant

## 2022-03-17 ENCOUNTER — Other Ambulatory Visit: Payer: Self-pay | Admitting: Nurse Practitioner

## 2022-03-17 DIAGNOSIS — Z1231 Encounter for screening mammogram for malignant neoplasm of breast: Secondary | ICD-10-CM

## 2022-04-30 ENCOUNTER — Other Ambulatory Visit: Payer: Self-pay | Admitting: Nurse Practitioner

## 2023-01-26 ENCOUNTER — Other Ambulatory Visit: Payer: Self-pay | Admitting: Nurse Practitioner

## 2023-01-26 DIAGNOSIS — Z1231 Encounter for screening mammogram for malignant neoplasm of breast: Secondary | ICD-10-CM

## 2023-04-07 ENCOUNTER — Ambulatory Visit
Admission: RE | Admit: 2023-04-07 | Discharge: 2023-04-07 | Disposition: A | Discharge status: Home / Self Care | Payer: Medicaid Other | Source: Ambulatory Visit | Attending: Nurse Practitioner | Admitting: Nurse Practitioner

## 2023-04-07 DIAGNOSIS — Z1231 Encounter for screening mammogram for malignant neoplasm of breast: Secondary | ICD-10-CM | POA: Diagnosis present

## 2023-04-10 ENCOUNTER — Other Ambulatory Visit: Payer: Self-pay | Admitting: Nurse Practitioner

## 2023-04-10 DIAGNOSIS — R928 Other abnormal and inconclusive findings on diagnostic imaging of breast: Secondary | ICD-10-CM

## 2023-04-10 DIAGNOSIS — N63 Unspecified lump in unspecified breast: Secondary | ICD-10-CM

## 2023-04-17 ENCOUNTER — Ambulatory Visit
Admission: RE | Admit: 2023-04-17 | Discharge: 2023-04-17 | Disposition: A | Discharge status: Home / Self Care | Payer: Medicaid Other | Source: Ambulatory Visit | Attending: Nurse Practitioner | Admitting: Nurse Practitioner

## 2023-04-17 DIAGNOSIS — N63 Unspecified lump in unspecified breast: Secondary | ICD-10-CM | POA: Insufficient documentation

## 2023-04-17 DIAGNOSIS — R928 Other abnormal and inconclusive findings on diagnostic imaging of breast: Secondary | ICD-10-CM | POA: Insufficient documentation

## 2023-10-07 ENCOUNTER — Other Ambulatory Visit: Payer: Self-pay | Admitting: Urology

## 2023-10-07 ENCOUNTER — Other Ambulatory Visit: Payer: Self-pay | Admitting: Obstetrics and Gynecology

## 2023-10-07 DIAGNOSIS — G8929 Other chronic pain: Secondary | ICD-10-CM

## 2024-03-21 ENCOUNTER — Encounter: Payer: Self-pay | Admitting: Neurology

## 2024-03-21 ENCOUNTER — Ambulatory Visit: Payer: Medicaid Other | Admitting: Neurology
